# Patient Record
Sex: Female | Born: 1955 | Race: White | Hispanic: No | Marital: Married | Smoking: Never smoker
Health system: Southern US, Community
[De-identification: ages and names within clinical notes are randomized; demographics above are authoritative.]

## PROBLEM LIST (undated history)

## (undated) DIAGNOSIS — I10 Essential (primary) hypertension: Secondary | ICD-10-CM

## (undated) HISTORY — DX: Essential (primary) hypertension: I10

## (undated) HISTORY — PX: YAG LASER APPLICATION: SHX6189

## (undated) HISTORY — PX: CATARACT EXTRACTION: SUR2

---

## 1998-01-13 ENCOUNTER — Other Ambulatory Visit: Admission: RE | Admit: 1998-01-13 | Discharge: 1998-01-13 | Payer: Self-pay | Admitting: *Deleted

## 1998-07-01 ENCOUNTER — Inpatient Hospital Stay (HOSPITAL_COMMUNITY): Admission: RE | Admit: 1998-07-01 | Discharge: 1998-07-03 | Payer: Self-pay | Admitting: Neurological Surgery

## 1999-02-02 ENCOUNTER — Ambulatory Visit (HOSPITAL_COMMUNITY): Admission: RE | Admit: 1999-02-02 | Discharge: 1999-02-02 | Payer: Self-pay | Admitting: Obstetrics and Gynecology

## 1999-02-02 ENCOUNTER — Encounter: Payer: Self-pay | Admitting: Obstetrics and Gynecology

## 1999-11-30 ENCOUNTER — Ambulatory Visit (HOSPITAL_COMMUNITY): Admission: RE | Admit: 1999-11-30 | Discharge: 1999-11-30 | Payer: Self-pay | Admitting: Cardiology

## 1999-11-30 ENCOUNTER — Encounter: Payer: Self-pay | Admitting: Cardiology

## 2006-05-06 ENCOUNTER — Emergency Department (HOSPITAL_COMMUNITY): Admission: EM | Admit: 2006-05-06 | Discharge: 2006-05-06 | Payer: Self-pay | Admitting: Emergency Medicine

## 2006-05-11 ENCOUNTER — Ambulatory Visit (HOSPITAL_BASED_OUTPATIENT_CLINIC_OR_DEPARTMENT_OTHER): Admission: RE | Admit: 2006-05-11 | Discharge: 2006-05-12 | Payer: Self-pay | Admitting: *Deleted

## 2006-08-04 ENCOUNTER — Other Ambulatory Visit: Admission: RE | Admit: 2006-08-04 | Discharge: 2006-08-04 | Payer: Self-pay | Admitting: Family Medicine

## 2009-01-08 ENCOUNTER — Emergency Department (HOSPITAL_COMMUNITY): Admission: EM | Admit: 2009-01-08 | Discharge: 2009-01-08 | Payer: Self-pay | Admitting: Emergency Medicine

## 2009-06-18 ENCOUNTER — Emergency Department (HOSPITAL_COMMUNITY): Admission: EM | Admit: 2009-06-18 | Discharge: 2009-06-18 | Payer: Self-pay | Admitting: Emergency Medicine

## 2009-12-24 ENCOUNTER — Emergency Department (HOSPITAL_COMMUNITY): Admission: EM | Admit: 2009-12-24 | Discharge: 2009-12-24 | Payer: Self-pay | Admitting: Emergency Medicine

## 2010-01-26 ENCOUNTER — Inpatient Hospital Stay (HOSPITAL_COMMUNITY): Admission: RE | Admit: 2010-01-26 | Discharge: 2010-01-28 | Payer: Self-pay | Admitting: Orthopedic Surgery

## 2010-02-23 ENCOUNTER — Encounter: Admission: RE | Admit: 2010-02-23 | Discharge: 2010-05-12 | Payer: Self-pay | Admitting: Orthopedic Surgery

## 2011-02-28 LAB — DIFFERENTIAL
Basophils Relative: 0 % (ref 0–1)
Eosinophils Absolute: 0.1 10*3/uL (ref 0.0–0.7)
Eosinophils Relative: 2 % (ref 0–5)
Lymphs Abs: 1.1 10*3/uL (ref 0.7–4.0)
Monocytes Absolute: 0.4 10*3/uL (ref 0.1–1.0)
Monocytes Relative: 8 % (ref 3–12)
Neutrophils Relative %: 67 % (ref 43–77)

## 2011-02-28 LAB — CBC
HCT: 38.5 % (ref 36.0–46.0)
MCHC: 33.1 g/dL (ref 30.0–36.0)
MCV: 87.3 fL (ref 78.0–100.0)
RBC: 4.41 MIL/uL (ref 3.87–5.11)
WBC: 4.8 10*3/uL (ref 4.0–10.5)

## 2011-03-03 LAB — BASIC METABOLIC PANEL
CO2: 25 mEq/L (ref 19–32)
Calcium: 8.5 mg/dL (ref 8.4–10.5)
Chloride: 104 mEq/L (ref 96–112)
Chloride: 106 mEq/L (ref 96–112)
GFR calc Af Amer: >60 mL/min (ref >60)
GFR calc Af Amer: >60 mL/min (ref >60)
Potassium: 3.7 mEq/L (ref 3.5–5.1)
Potassium: 4 mEq/L (ref 3.5–5.1)
Sodium: 137 mEq/L (ref 135–145)
Sodium: 140 mEq/L (ref 135–145)

## 2011-03-03 LAB — CBC
HCT: 27.7 % — ABNORMAL LOW (ref 36.0–46.0)
HCT: 29.8 % — ABNORMAL LOW (ref 36.0–46.0)
Hemoglobin: 10.1 g/dL — ABNORMAL LOW (ref 12.0–15.0)
Hemoglobin: 9.4 g/dL — ABNORMAL LOW (ref 12.0–15.0)
MCHC: 33.9 g/dL (ref 30.0–36.0)
MCHC: 34 g/dL (ref 30.0–36.0)
MCV: 88.1 fL (ref 78.0–100.0)
MCV: 88.3 fL (ref 78.0–100.0)
Platelets: 310 10*3/uL (ref 150–400)
RBC: 3.14 MIL/uL — ABNORMAL LOW (ref 3.87–5.11)
RBC: 3.32 MIL/uL — ABNORMAL LOW (ref 3.87–5.11)
RDW: 15 % (ref 11.5–15.5)

## 2011-03-03 LAB — COMPREHENSIVE METABOLIC PANEL
ALT: 18 U/L (ref 0–35)
AST: 18 U/L (ref 0–37)
Albumin: 4.1 g/dL (ref 3.5–5.2)
CO2: 26 mEq/L (ref 19–32)
Calcium: 9.3 mg/dL (ref 8.4–10.5)
Creatinine, Ser: 0.8 mg/dL (ref 0.4–1.2)
GFR calc Af Amer: >60 mL/min (ref >60)
GFR calc non Af Amer: >60 mL/min (ref >60)
Sodium: 140 mEq/L (ref 135–145)
Total Protein: 7.2 g/dL (ref 6.0–8.3)

## 2011-03-03 LAB — ABO/RH: ABO/RH(D): O POS

## 2011-03-03 LAB — PROTIME-INR
INR: 1.02 (ref 0.00–1.49)
INR: 1.2 (ref 0.00–1.49)
INR: 1.5 — ABNORMAL HIGH (ref 0.00–1.49)
Prothrombin Time: 13.3 seconds (ref 11.6–15.2)

## 2011-03-03 LAB — URINE CULTURE

## 2011-03-03 LAB — URINALYSIS, ROUTINE W REFLEX MICROSCOPIC
Glucose, UA: NEGATIVE mg/dL
Protein, ur: NEGATIVE mg/dL
Specific Gravity, Urine: 1.025 (ref 1.005–1.030)
Urobilinogen, UA: 0.2 mg/dL (ref 0.0–1.0)

## 2011-03-03 LAB — DIFFERENTIAL
Eosinophils Absolute: 0.1 10*3/uL (ref 0.0–0.7)
Eosinophils Relative: 2 % (ref 0–5)
Lymphocytes Relative: 30 % (ref 12–46)
Lymphs Abs: 1.8 10*3/uL (ref 0.7–4.0)
Monocytes Relative: 6 % (ref 3–12)

## 2011-03-03 LAB — TYPE AND SCREEN

## 2011-03-21 LAB — DIFFERENTIAL
Eosinophils Absolute: 0.1 10*3/uL (ref 0.0–0.7)
Eosinophils Relative: 2 % (ref 0–5)
Lymphs Abs: 1.6 10*3/uL (ref 0.7–4.0)
Monocytes Relative: 8 % (ref 3–12)

## 2011-03-21 LAB — COMPREHENSIVE METABOLIC PANEL
Albumin: 4.1 g/dL (ref 3.5–5.2)
Alkaline Phosphatase: 93 U/L (ref 39–117)
BUN: 12 mg/dL (ref 6–23)
Chloride: 102 mEq/L (ref 96–112)
Potassium: 5.1 mEq/L (ref 3.5–5.1)
Total Bilirubin: 1.2 mg/dL (ref 0.3–1.2)

## 2011-03-21 LAB — URINALYSIS, ROUTINE W REFLEX MICROSCOPIC
Bilirubin Urine: NEGATIVE
Hgb urine dipstick: NEGATIVE
Protein, ur: NEGATIVE mg/dL
Urobilinogen, UA: 0.2 mg/dL (ref 0.0–1.0)

## 2011-03-21 LAB — CBC
HCT: 32.5 % — ABNORMAL LOW (ref 36.0–46.0)
Hemoglobin: 10.7 g/dL — ABNORMAL LOW (ref 12.0–15.0)
MCV: 89.6 fL (ref 78.0–100.0)
RBC: 3.63 MIL/uL — ABNORMAL LOW (ref 3.87–5.11)
WBC: 7 10*3/uL (ref 4.0–10.5)

## 2011-04-30 NOTE — Op Note (Signed)
NAMEJANIE, CAPP NO.:  [id number]   MEDICAL RECORD NO.:  [id number]          PATIENT TYPE:  AMB   LOCATION:  DSC                          FACILITY:  [facility]   PHYSICIAN:  Tennis Must Meyerdierks, M.D.DATE OF BIRTH:  [birth year]   DATE OF PROCEDURE:  05/11/2006  DATE OF DISCHARGE:                                 OPERATIVE REPORT   PREOPERATIVE DIAGNOSIS:  Fracture right distal radius.   POSTOPERATIVE DIAGNOSIS:  Fracture right distal radius.   PROCEDURE:  Open reduction and internal fixation of right distal radius  fracture.   SURGEON:  Lowell Bouton, M.D.   ANESTHESIA:  General.   OPERATIVE FINDINGS:  The patient had a comminuted distal radius fracture  that was dorsally angulated.   PROCEDURE:  Under general anesthesia with a tourniquet on the right arm, the  right hand was prepped and draped in the usual fashion and after  exsanguinating the limb, the tourniquet was inflated to 250 mmHg.  The index  and long fingers were placed in finger traps and 10 pounds of traction was  applied across the end of the table.  A longitudinal incision was made  overlying the FCR tendon in the wrist and carried down through the  subcutaneous tissues.  Bleeding points were coagulated.  Blunt dissection  was carried through the FCR sheath and the FCR tendon was retracted ulnarly.  The floor of the sheath was incised with the scissors.  Blunt dissection was  then carried down through the muscles to the pronator quadratus.  This was  then sharply incised off of the radial attachment to the radius.  A Freer  elevator was used to elevate the pronator off of the arm radius.  The  fracture site was then identified and a Therapist, nutritional was used to reduce  the fracture.  The x-rays showed good position and the traction was  released.  A right sided DVR standard plate was then applied.  The sliding  screw was inserted first and then adjusted under x-ray control.  Two  of the  non-threaded pegs were then inserted and then alignment was again evaluated  and looked good.  The remaining 3.5 mm screws were then placed proximally  using three screws proximally, leaving the most proximal hole empty as it  was ulnarly placed on the radius.  The pegs were then inserted using smooth  pegs in the proximal four holes and partially threaded pegs in two of the  distal holes.  One peg hole was left empty.  The wound was then irrigated  copiously with saline.  The pronator quadratus was reattached with 4-0  Vicryl suture.  A TLS drain was inserted and the subcutaneous tissue was  closed with 4-0 Vicryl.  The skin was closed with 3-0 subcuticular Prolene.  Steri-Strips were applied.  0.5% Marcaine was inserted in the wound edges  for pain control. The patient was placed in a volar wrist splint.  She went  to the recovery room awake in stable, good condition.      Lowell Bouton, M.D.  Electronically Signed  EMM/MEDQ  D:  05/11/2006  T:  05/11/2006  Job:  [id number]

## 2012-10-31 ENCOUNTER — Other Ambulatory Visit (HOSPITAL_COMMUNITY)
Admission: RE | Admit: 2012-10-31 | Discharge: 2012-10-31 | Disposition: A | Discharge status: Home / Self Care | Payer: Medicare Other | Source: Ambulatory Visit | Attending: Family Medicine | Admitting: Family Medicine

## 2012-10-31 ENCOUNTER — Other Ambulatory Visit: Payer: Self-pay | Admitting: Family Medicine

## 2012-10-31 DIAGNOSIS — Z124 Encounter for screening for malignant neoplasm of cervix: Secondary | ICD-10-CM | POA: Insufficient documentation

## 2012-10-31 DIAGNOSIS — Z1151 Encounter for screening for human papillomavirus (HPV): Secondary | ICD-10-CM | POA: Insufficient documentation

## 2014-02-06 ENCOUNTER — Ambulatory Visit (INDEPENDENT_AMBULATORY_CARE_PROVIDER_SITE_OTHER): Payer: Medicare Other | Admitting: Cardiovascular Disease

## 2014-02-06 ENCOUNTER — Ambulatory Visit: Payer: Medicare Other | Admitting: Cardiovascular Disease

## 2014-02-06 ENCOUNTER — Encounter: Payer: Self-pay | Admitting: Cardiovascular Disease

## 2014-02-06 VITALS — BP 130/76 | HR 54 | Resp 16 | Ht 64.0 in | Wt 186.8 lb

## 2014-02-06 DIAGNOSIS — E785 Hyperlipidemia, unspecified: Secondary | ICD-10-CM

## 2014-02-06 DIAGNOSIS — N183 Chronic kidney disease, stage 3 unspecified: Secondary | ICD-10-CM

## 2014-02-06 DIAGNOSIS — E1122 Type 2 diabetes mellitus with diabetic chronic kidney disease: Secondary | ICD-10-CM

## 2014-02-06 DIAGNOSIS — R9431 Abnormal electrocardiogram [ECG] [EKG]: Secondary | ICD-10-CM

## 2014-02-06 DIAGNOSIS — E1129 Type 2 diabetes mellitus with other diabetic kidney complication: Secondary | ICD-10-CM

## 2014-02-06 DIAGNOSIS — R0602 Shortness of breath: Secondary | ICD-10-CM

## 2014-02-06 DIAGNOSIS — I10 Essential (primary) hypertension: Secondary | ICD-10-CM

## 2014-02-06 DIAGNOSIS — Z01810 Encounter for preprocedural cardiovascular examination: Secondary | ICD-10-CM

## 2014-02-06 NOTE — Patient Instructions (Signed)
Your physician has requested that you have a lexiscan myoview. For further information please visit [email or web address]. Please follow instruction sheet, as given.  We will call you with the results.  Your physician recommends that you schedule a follow-up appointment in: AS NEEDED with Dr. Royann Shiversroitoru.

## 2014-02-08 ENCOUNTER — Encounter: Payer: Self-pay | Admitting: Cardiovascular Disease

## 2014-02-08 DIAGNOSIS — Z01810 Encounter for preprocedural cardiovascular examination: Secondary | ICD-10-CM

## 2014-02-08 DIAGNOSIS — N183 Chronic kidney disease, stage 3 unspecified: Secondary | ICD-10-CM | POA: Insufficient documentation

## 2014-02-08 DIAGNOSIS — E1122 Type 2 diabetes mellitus with diabetic chronic kidney disease: Secondary | ICD-10-CM | POA: Insufficient documentation

## 2014-02-08 DIAGNOSIS — I10 Essential (primary) hypertension: Secondary | ICD-10-CM | POA: Insufficient documentation

## 2014-02-08 DIAGNOSIS — R9431 Abnormal electrocardiogram [ECG] [EKG]: Secondary | ICD-10-CM | POA: Insufficient documentation

## 2014-02-08 DIAGNOSIS — E785 Hyperlipidemia, unspecified: Secondary | ICD-10-CM | POA: Insufficient documentation

## 2014-02-08 NOTE — Progress Notes (Signed)
Patient ID: Renee Snyder, female   DOB: [birth year], 58 y.o.   MRN: [id number]      Reason for office visit Abnormal ECG, preoperative cardiovascular exam  Renee Snyder is a 58 year old woman without known structural heart disease, but with numerous coronary risk factors (diabetes mellitus, hypertension, hyperlipidemia) and abnormalities on her preoperative electrocardiogram. She plans to undergo right total knee replacement in the near future with Dr. Thurston Hole. The procedure has not yet been scheduled. She had left total knee replacement about 3-4 years ago and had no cardiac problems at that time. Her electrocardiogram has changed. Unfortunately, I don't have access to the ECG that triggered this referral, but in our office her echocardiogram shows flattened T waves throughout the entire anterior precordium as well as negative T waves in a couple of the inferior leads. The background rhythm is sinus bradycardia. There is no evidence of left ventricular hypertrophy.  She denies chest pain at rest or with activity but does complain of shortness of breath. It is hard to establish her functional status and certainly problems over down more and cardiac symptoms 2. She tries to walk 2-3 days a week for 20-25 minutes at a time but is often unable to exercise for a couple of days afterwards because of leg pain. As far as we know there is no family history of premature coronary disease. Her mother died young, at age 50 of noncardiac causes.   No Known Allergies  Current Outpatient Prescriptions  Medication Sig Dispense Refill  . amLODipine (NORVASC) 5 MG tablet Take 5 mg by mouth daily.      . cholecalciferol (VITAMIN D) 1000 UNITS tablet Take 1,000 Units by mouth daily.      Marland Kitchen losartan (COZAAR) 100 MG tablet Take 100 mg by mouth daily.      . metoprolol succinate (TOPROL-XL) 100 MG 24 hr tablet Take 100 mg by mouth 2 (two) times daily. Take with or immediately following a meal.      . oxycodone  (ROXICODONE) 30 MG immediate release tablet Take 30 mg by mouth every 4 (four) hours as needed for pain.      Marland Kitchen QUEtiapine Fumarate (SEROQUEL XR) 150 MG 24 hr tablet Take 150 mg by mouth at bedtime.      . rosuvastatin (CRESTOR) 20 MG tablet Take 20 mg by mouth daily.      Marland Kitchen venlafaxine XR (EFFEXOR-XR) 150 MG 24 hr capsule Take 150 mg by mouth daily with breakfast.       No current facility-administered medications for this visit.    No past medical history on file.  No past surgical history on file.  No family history on file.  History   Social History  . Marital Status: Married    Spouse Name: N/A    Number of Children: N/A  . Years of Education: N/A   Occupational History  . Not on file.   Social History Main Topics  . Smoking status: Never Smoker   . Smokeless tobacco: Not on file  . Alcohol Use: Yes     Comment: occas  . Drug Use: No  . Sexual Activity: Not on file   Other Topics Concern  . Not on file   Social History Narrative  . No narrative on file    Review of systems: The patient specifically denies any chest pain at rest or with exertion, dyspnea at rest or with exertion, orthopnea, paroxysmal nocturnal dyspnea, syncope, palpitations, focal neurological deficits, intermittent claudication, lower extremity edema,  unexplained weight gain, cough, hemoptysis or wheezing.  The patient also denies abdominal pain, nausea, vomiting, dysphagia, diarrhea, constipation, polyuria, polydipsia, dysuria, hematuria, frequency, urgency, abnormal bleeding or bruising, fever, chills, unexpected weight changes, mood swings, change in skin or hair texture, change in voice quality, auditory or visual problems, allergic reactions or rashes, new musculoskeletal complaints other than usual "aches and pains".   PHYSICAL EXAM BP 130/76  Pulse 54  Resp 16  Ht 5\' 4"  (1.626 m)  Wt 84.732 kg (186 lb 12.8 oz)  BMI 32.05 kg/m2  General: Alert, oriented x3, no distress Head: no  evidence of trauma, PERRL, EOMI, no exophtalmos or lid lag, no myxedema, no xanthelasma; normal ears, nose and oropharynx Neck: normal jugular venous pulsations and no hepatojugular reflux; brisk carotid pulses without delay and no carotid bruits Chest: clear to auscultation, no signs of consolidation by percussion or palpation, normal fremitus, symmetrical and full respiratory excursions Cardiovascular: normal position and quality of the apical impulse, regular rhythm, normal first and second heart sounds, no murmurs, rubs or gallops Abdomen: no tenderness or distention, no masses by palpation, no abnormal pulsatility or arterial bruits, normal bowel sounds, no hepatosplenomegaly Extremities: no clubbing, cyanosis or edema; 2+ radial, ulnar and brachial pulses bilaterally; 2+ right femoral, posterior tibial and dorsalis pedis pulses; 2+ left femoral, posterior tibial and dorsalis pedis pulses; no subclavian or femoral bruits Neurological: grossly nonfocal   EKG: Sinus bradycardia, nonspecific ST-T wave changes described above  Lipid Panel  January 2015 total cholesterol 158, triglycerides 105, HDL 51, LDL 86 Hemoglobin Z3YA1c 6.9% TSH mildly elevated at 5.4, free T4 at the lower limit of normal 0.63  BMET Glucose 117, creatinine 1.3 to (GFR 41), followup creatinine 2 weeks later improved at 1.27 (estimated GFR 43),  potassium 4.5 normal LFTs Hemoglobin 12.7   ASSESSMENT AND PLAN New electrocardiogram abnormalities consistent with ischemia noted during preoperative cardiovascular examination Unfortunately, due to her orthopedic limitations it is hard to assess her functional status and she is unable to perform a simple treadmill stress test. Because of the new electrocardiographic abnormalities, in the setting of at least intermediate risk factors for coronary disease (obesity, diabetes mellitus with borderline control, hyperlipidemia, hypertension) have recommended that she have a functional  study (Lexiscan Myoview) before she undergoes surgery. If the study is normal, I would not perform additional cardiac evaluation before her surgery. She already takes a high dose of beta blocker and this should be continued without interruption around the time of surgery. However, if there is a high risk of finding (a large reversible anterior defect), have recommended coronary angiography before she has any type of noncardiac surgery.   Orders Placed This Encounter  Procedures  . Myocardial Perfusion Imaging  . EKG 12-Lead   Meds ordered this encounter  Medications  . metoprolol succinate (TOPROL-XL) 100 MG 24 hr tablet    Sig: Take 100 mg by mouth 2 (two) times daily. Take with or immediately following a meal.  . losartan (COZAAR) 100 MG tablet    Sig: Take 100 mg by mouth daily.  Marland Kitchen. amLODipine (NORVASC) 5 MG tablet    Sig: Take 5 mg by mouth daily.  Marland Kitchen. venlafaxine XR (EFFEXOR-XR) 150 MG 24 hr capsule    Sig: Take 150 mg by mouth daily with breakfast.  . QUEtiapine Fumarate (SEROQUEL XR) 150 MG 24 hr tablet    Sig: Take 150 mg by mouth at bedtime.  Marland Kitchen. oxycodone (ROXICODONE) 30 MG immediate release tablet    Sig:  Take 30 mg by mouth every 4 (four) hours as needed for pain.  . cholecalciferol (VITAMIN D) 1000 UNITS tablet    Sig: Take 1,000 Units by mouth daily.  . rosuvastatin (CRESTOR) 20 MG tablet    Sig: Take 20 mg by mouth daily.    Junious Silk, MD, [facility] [phone] office [phone] pager

## 2014-02-08 NOTE — Assessment & Plan Note (Deleted)
Unfortunately, due to her orthopedic limitations it is hard to assess her functional status and she is unable to perform a simple treadmill stress test. Because of the new electrocardiographic abnormalities, in the setting of at least intermediate risk factors for coronary disease (obesity, diabetes mellitus with borderline control, hyperlipidemia, hypertension) have recommended that she have a functional study (Lexiscan Myoview) before she undergoes surgery. If the study is normal, I would not perform additional cardiac evaluation before her surgery. She already takes a high dose of beta blocker and this should be continued without interruption around the time of surgery. However, if there is a high risk of finding (a large reversible anterior defect), have recommended coronary angiography before she has any type of noncardiac surgery.

## 2014-02-08 NOTE — Assessment & Plan Note (Signed)
Unfortunately, due to her orthopedic limitations it is hard to assess her functional status and she is unable to perform a simple treadmill stress test. Because of the new electrocardiographic abnormalities, in the setting of at least intermediate risk factors for coronary disease (obesity, diabetes mellitus with borderline control, hyperlipidemia, hypertension) have recommended that she have a functional study (Lexiscan Myoview) before she undergoes surgery. If the study is normal, I would not perform additional cardiac evaluation before her surgery. She already takes a high dose of beta blocker and this should be continued without interruption around the time of surgery. However, if there is a high risk of finding (a large reversible anterior defect), have recommended coronary angiography before she has any type of noncardiac surgery. 

## 2014-02-15 ENCOUNTER — Ambulatory Visit (HOSPITAL_COMMUNITY)
Admission: RE | Admit: 2014-02-15 | Discharge: 2014-02-15 | Disposition: A | Discharge status: Home / Self Care | Payer: Medicare Other | Source: Ambulatory Visit | Attending: Cardiovascular Disease | Admitting: Cardiovascular Disease

## 2014-02-15 DIAGNOSIS — R0602 Shortness of breath: Secondary | ICD-10-CM

## 2014-02-15 DIAGNOSIS — R9431 Abnormal electrocardiogram [ECG] [EKG]: Secondary | ICD-10-CM | POA: Insufficient documentation

## 2014-02-15 MED ORDER — REGADENOSON 0.4 MG/5ML IV SOLN
0.4000 mg | Freq: Once | INTRAVENOUS | Status: AC
  Administered 2014-02-15: 0.4 mg via INTRAVENOUS

## 2014-02-15 MED ORDER — TECHNETIUM TC 99M SESTAMIBI GENERIC - CARDIOLITE
10.8000 | Freq: Once | INTRAVENOUS | Status: AC | PRN
  Administered 2014-02-15: 11 via INTRAVENOUS

## 2014-02-15 MED ORDER — TECHNETIUM TC 99M SESTAMIBI GENERIC - CARDIOLITE
29.6000 | Freq: Once | INTRAVENOUS | Status: AC | PRN
  Administered 2014-02-15: 30 via INTRAVENOUS

## 2014-02-15 MED ORDER — AMINOPHYLLINE 25 MG/ML IV SOLN
75.0000 mg | Freq: Once | INTRAVENOUS | Status: AC
Start: 2014-02-15 — End: 2014-02-15
  Administered 2014-02-15: 75 mg via INTRAVENOUS

## 2014-02-15 NOTE — Procedures (Addendum)
[facility] [address] [place] [address] [phone]  Cardiology Nuclear Med Study  Annia BeltMorgan Rowles is a 58 y.o. female     MRN : [id number]     DOB: [birth year]  Procedure Date: 02/15/2014  Nuclear Med Background Indication for Stress Test:  Surgical Clearance and Abnormal EKG History:  No prior cardiac or respiratory history reported. Cardiac Risk Factors: Hypertension, Lipids and Obesity  Symptoms:  SOB   Nuclear Pre-Procedure Caffeine/Decaff Intake:  1:00am NPO After: 11am   IV Site: R Hand  IV 0.9% NS with Angio Cath:  22g  Chest Size (in):  n/a IV Started by: Emmit PomfretAmanda Hicks, RN  Height: 5\' 4"  (1.626 m)  Cup Size: C  BMI:  Body mass index is 31.91 kg/(m^2). Weight:  186 lb (84.369 kg)   Tech Comments:  n/a    Nuclear Med Study 1 or 2 day study: 1 day  Stress Test Type:  Lexiscan  Order Authorizing Provider:  Thurmon FairMihai Croitoru, MD   Resting Radionuclide: Technetium 8837m Sestamibi  Resting Radionuclide Dose: 10.8 mCi   Stress Radionuclide:  Technetium 7237m Sestamibi  Stress Radionuclide Dose: 29.6 mCi           Stress Protocol Rest HR: 52 Stress HR: 38  Rest BP: 120/81 Stress BP:  106/82  Exercise Time (min): n/a METS: n/a   Predicted Max HR: 161 bpm % Max HR: 39.13 bpm Rate Pressure Product: 7560  Dose of Adenosine (mg):  n/a Dose of Lexiscan: 0.4 mg  Dose of Atropine (mg): n/a Dose of Dobutamine: n/a mcg/kg/min (at max HR)  Stress Test Technologist: Esperanza Sheetserry-Marie Martin, CCT Nuclear Technologist: Gonzella LexPam Phillips, CNMT   Rest Procedure:  Myocardial perfusion imaging was performed at rest 45 minutes following the intravenous administration of Technetium 7937m Sestamibi. Stress Procedure:  The patient received IV Lexiscan 0.4 mg over 15-seconds.  Technetium 7337m Sestamibi injected at 30-seconds. The patient experienced SOB, Dizziness, Stomach discomfort and right arm discomfort; 75 mg of IV Aminophylline was administered  with resolution of symptoms. There were no significant changes with Lexiscan.  Quantitative spect images were obtained after a 45 minute delay.  Transient Ischemic Dilatation (Normal <1.22):  0.95 Lung/Heart Ratio (Normal <0.45):  0.28 QGS EDV:  84 ml QGS ESV:  23 ml LV Ejection Fraction: 73%  Signed by     Rest ECG: NSR - Normal EKG  Stress ECG: No significant change from baseline ECG  QPS Raw Data Images:  Normal; no motion artifact; normal heart/lung ratio. Stress Images:  Normal homogeneous uptake in all areas of the myocardium. Rest Images:  Normal homogeneous uptake in all areas of the myocardium. Subtraction (SDS):  No evidence of ischemia.  Impression Exercise Capacity:  Lexiscan with no exercise. BP Response:  Normal blood pressure response. Clinical Symptoms:  No significant symptoms noted. ECG Impression:  No significant ST segment change suggestive of ischemia. Comparison with Prior Nuclear Study: No previous nuclear study performed  Overall Impression:  Normal stress nuclear study.  LV Wall Motion:  NL LV Function; NL Wall Motion   Runell GessBERRY,JONATHAN J, MD  02/15/2014 5:22 PM

## 2014-02-19 ENCOUNTER — Telehealth: Payer: Self-pay | Admitting: *Deleted

## 2014-02-19 NOTE — Telephone Encounter (Signed)
Message copied by Vita BarleyLASSITER, Arin Peral A on Tue Feb 19, 2014 12:13 PM ------      Message from: Thurmon FairROITORU, MIHAI      Created: Fri Feb 15, 2014  6:40 PM       Normal nuclear stress test ------

## 2014-02-19 NOTE — Telephone Encounter (Signed)
Surgical clearance letter sent to Dr. Wainer 

## 2014-02-26 ENCOUNTER — Ambulatory Visit: Payer: Medicare Other | Admitting: Cardiovascular Disease

## 2014-03-25 ENCOUNTER — Ambulatory Visit (HOSPITAL_COMMUNITY): Payer: Medicare Other | Attending: Orthopedic Surgery | Admitting: Cardiology

## 2014-03-25 ENCOUNTER — Other Ambulatory Visit (HOSPITAL_COMMUNITY): Payer: Self-pay | Admitting: *Deleted

## 2014-03-25 DIAGNOSIS — M7989 Other specified soft tissue disorders: Secondary | ICD-10-CM

## 2014-03-25 DIAGNOSIS — M79609 Pain in unspecified limb: Secondary | ICD-10-CM

## 2014-03-25 DIAGNOSIS — R609 Edema, unspecified: Secondary | ICD-10-CM

## 2014-03-25 NOTE — Progress Notes (Signed)
Lower venous duplex limited completed 

## 2014-04-22 ENCOUNTER — Other Ambulatory Visit (HOSPITAL_COMMUNITY): Payer: Medicare Other

## 2014-04-29 ENCOUNTER — Inpatient Hospital Stay (HOSPITAL_COMMUNITY): Admission: RE | Admit: 2014-04-29 | Payer: Medicare Other | Source: Ambulatory Visit | Admitting: Orthopedic Surgery

## 2014-04-29 ENCOUNTER — Encounter (HOSPITAL_COMMUNITY): Admission: RE | Payer: Self-pay | Source: Ambulatory Visit

## 2014-04-29 SURGERY — ARTHROPLASTY, KNEE, TOTAL
Anesthesia: General | Site: Knee | Laterality: Right

## 2014-08-30 ENCOUNTER — Other Ambulatory Visit (HOSPITAL_COMMUNITY): Payer: Medicare Other

## 2014-09-09 ENCOUNTER — Inpatient Hospital Stay (HOSPITAL_COMMUNITY): Admission: RE | Admit: 2014-09-09 | Payer: Medicare Other | Source: Ambulatory Visit | Admitting: Orthopedic Surgery

## 2014-09-09 ENCOUNTER — Encounter (HOSPITAL_COMMUNITY): Admission: RE | Payer: Self-pay | Source: Ambulatory Visit

## 2014-09-09 SURGERY — ARTHROPLASTY, KNEE, TOTAL
Anesthesia: General | Laterality: Right

## 2016-03-15 DIAGNOSIS — F3181 Bipolar II disorder: Secondary | ICD-10-CM | POA: Diagnosis not present

## 2016-03-15 DIAGNOSIS — M170 Bilateral primary osteoarthritis of knee: Secondary | ICD-10-CM | POA: Diagnosis not present

## 2016-05-12 DIAGNOSIS — G8929 Other chronic pain: Secondary | ICD-10-CM | POA: Diagnosis not present

## 2016-05-12 DIAGNOSIS — M170 Bilateral primary osteoarthritis of knee: Secondary | ICD-10-CM | POA: Diagnosis not present

## 2016-05-12 DIAGNOSIS — G894 Chronic pain syndrome: Secondary | ICD-10-CM | POA: Diagnosis not present

## 2016-05-12 DIAGNOSIS — F3181 Bipolar II disorder: Secondary | ICD-10-CM | POA: Diagnosis not present

## 2016-05-12 DIAGNOSIS — M25562 Pain in left knee: Secondary | ICD-10-CM | POA: Diagnosis not present

## 2016-05-12 DIAGNOSIS — M25561 Pain in right knee: Secondary | ICD-10-CM | POA: Diagnosis not present

## 2016-07-14 DIAGNOSIS — M25562 Pain in left knee: Secondary | ICD-10-CM | POA: Diagnosis not present

## 2016-07-14 DIAGNOSIS — M25561 Pain in right knee: Secondary | ICD-10-CM | POA: Diagnosis not present

## 2016-07-14 DIAGNOSIS — M170 Bilateral primary osteoarthritis of knee: Secondary | ICD-10-CM | POA: Diagnosis not present

## 2016-07-14 DIAGNOSIS — F3181 Bipolar II disorder: Secondary | ICD-10-CM | POA: Diagnosis not present

## 2016-07-14 DIAGNOSIS — G8929 Other chronic pain: Secondary | ICD-10-CM | POA: Diagnosis not present

## 2016-08-17 DIAGNOSIS — Z23 Encounter for immunization: Secondary | ICD-10-CM | POA: Diagnosis not present

## 2016-09-15 DIAGNOSIS — M170 Bilateral primary osteoarthritis of knee: Secondary | ICD-10-CM | POA: Diagnosis not present

## 2016-09-15 DIAGNOSIS — F3181 Bipolar II disorder: Secondary | ICD-10-CM | POA: Diagnosis not present

## 2016-09-15 DIAGNOSIS — G8929 Other chronic pain: Secondary | ICD-10-CM | POA: Diagnosis not present

## 2016-09-15 DIAGNOSIS — M545 Low back pain: Secondary | ICD-10-CM | POA: Diagnosis not present

## 2016-09-24 DIAGNOSIS — K645 Perianal venous thrombosis: Secondary | ICD-10-CM | POA: Diagnosis not present

## 2016-09-24 DIAGNOSIS — Z1389 Encounter for screening for other disorder: Secondary | ICD-10-CM | POA: Diagnosis not present

## 2016-10-26 DIAGNOSIS — R0789 Other chest pain: Secondary | ICD-10-CM | POA: Diagnosis not present

## 2016-10-29 ENCOUNTER — Other Ambulatory Visit: Payer: Self-pay | Admitting: Family Medicine

## 2016-10-29 DIAGNOSIS — R9389 Abnormal findings on diagnostic imaging of other specified body structures: Secondary | ICD-10-CM

## 2016-11-08 DIAGNOSIS — E782 Mixed hyperlipidemia: Secondary | ICD-10-CM | POA: Diagnosis not present

## 2016-11-08 DIAGNOSIS — E6609 Other obesity due to excess calories: Secondary | ICD-10-CM | POA: Diagnosis not present

## 2016-11-08 DIAGNOSIS — E119 Type 2 diabetes mellitus without complications: Secondary | ICD-10-CM | POA: Diagnosis not present

## 2016-11-08 DIAGNOSIS — G8929 Other chronic pain: Secondary | ICD-10-CM | POA: Diagnosis not present

## 2016-11-08 DIAGNOSIS — Z7984 Long term (current) use of oral hypoglycemic drugs: Secondary | ICD-10-CM | POA: Diagnosis not present

## 2016-11-08 DIAGNOSIS — Z6833 Body mass index (BMI) 33.0-33.9, adult: Secondary | ICD-10-CM | POA: Diagnosis not present

## 2016-11-08 DIAGNOSIS — I10 Essential (primary) hypertension: Secondary | ICD-10-CM | POA: Diagnosis not present

## 2016-11-09 ENCOUNTER — Ambulatory Visit
Admission: RE | Admit: 2016-11-09 | Discharge: 2016-11-09 | Disposition: A | Discharge status: Home / Self Care | Payer: Medicare Other | Source: Ambulatory Visit | Attending: Family Medicine | Admitting: Family Medicine

## 2016-11-09 DIAGNOSIS — R9389 Abnormal findings on diagnostic imaging of other specified body structures: Secondary | ICD-10-CM

## 2016-11-09 DIAGNOSIS — R918 Other nonspecific abnormal finding of lung field: Secondary | ICD-10-CM | POA: Diagnosis not present

## 2016-11-10 DIAGNOSIS — G894 Chronic pain syndrome: Secondary | ICD-10-CM | POA: Diagnosis not present

## 2016-11-10 DIAGNOSIS — G8929 Other chronic pain: Secondary | ICD-10-CM | POA: Diagnosis not present

## 2016-11-10 DIAGNOSIS — M791 Myalgia: Secondary | ICD-10-CM | POA: Diagnosis not present

## 2016-11-10 DIAGNOSIS — M545 Low back pain: Secondary | ICD-10-CM | POA: Diagnosis not present

## 2016-11-10 DIAGNOSIS — F3181 Bipolar II disorder: Secondary | ICD-10-CM | POA: Diagnosis not present

## 2016-11-10 DIAGNOSIS — M170 Bilateral primary osteoarthritis of knee: Secondary | ICD-10-CM | POA: Diagnosis not present

## 2017-01-12 DIAGNOSIS — M545 Low back pain: Secondary | ICD-10-CM | POA: Diagnosis not present

## 2017-01-12 DIAGNOSIS — G8929 Other chronic pain: Secondary | ICD-10-CM | POA: Diagnosis not present

## 2017-01-12 DIAGNOSIS — F3181 Bipolar II disorder: Secondary | ICD-10-CM | POA: Diagnosis not present

## 2017-01-12 DIAGNOSIS — M170 Bilateral primary osteoarthritis of knee: Secondary | ICD-10-CM | POA: Diagnosis not present

## 2017-04-11 DIAGNOSIS — M25561 Pain in right knee: Secondary | ICD-10-CM | POA: Diagnosis not present

## 2017-04-11 DIAGNOSIS — M545 Low back pain: Secondary | ICD-10-CM | POA: Diagnosis not present

## 2017-04-11 DIAGNOSIS — M25562 Pain in left knee: Secondary | ICD-10-CM | POA: Diagnosis not present

## 2017-04-11 DIAGNOSIS — F3181 Bipolar II disorder: Secondary | ICD-10-CM | POA: Diagnosis not present

## 2017-05-12 ENCOUNTER — Other Ambulatory Visit: Payer: Self-pay | Admitting: Family Medicine

## 2017-05-12 DIAGNOSIS — R918 Other nonspecific abnormal finding of lung field: Secondary | ICD-10-CM

## 2017-05-30 ENCOUNTER — Ambulatory Visit
Admission: RE | Admit: 2017-05-30 | Discharge: 2017-05-30 | Disposition: A | Discharge status: Home / Self Care | Payer: Medicare Other | Source: Ambulatory Visit | Attending: Family Medicine | Admitting: Family Medicine

## 2017-05-30 DIAGNOSIS — R918 Other nonspecific abnormal finding of lung field: Secondary | ICD-10-CM

## 2017-06-08 DIAGNOSIS — M170 Bilateral primary osteoarthritis of knee: Secondary | ICD-10-CM | POA: Diagnosis not present

## 2017-06-08 DIAGNOSIS — M545 Low back pain: Secondary | ICD-10-CM | POA: Diagnosis not present

## 2017-06-08 DIAGNOSIS — R7303 Prediabetes: Secondary | ICD-10-CM | POA: Diagnosis not present

## 2017-06-08 DIAGNOSIS — F3181 Bipolar II disorder: Secondary | ICD-10-CM | POA: Diagnosis not present

## 2017-06-27 DIAGNOSIS — Z6832 Body mass index (BMI) 32.0-32.9, adult: Secondary | ICD-10-CM | POA: Diagnosis not present

## 2017-06-27 DIAGNOSIS — I10 Essential (primary) hypertension: Secondary | ICD-10-CM | POA: Diagnosis not present

## 2017-06-27 DIAGNOSIS — E782 Mixed hyperlipidemia: Secondary | ICD-10-CM | POA: Diagnosis not present

## 2017-06-27 DIAGNOSIS — E119 Type 2 diabetes mellitus without complications: Secondary | ICD-10-CM | POA: Diagnosis not present

## 2017-08-10 DIAGNOSIS — M25561 Pain in right knee: Secondary | ICD-10-CM | POA: Diagnosis not present

## 2017-08-10 DIAGNOSIS — M170 Bilateral primary osteoarthritis of knee: Secondary | ICD-10-CM | POA: Diagnosis not present

## 2017-08-10 DIAGNOSIS — F3181 Bipolar II disorder: Secondary | ICD-10-CM | POA: Diagnosis not present

## 2017-08-10 DIAGNOSIS — M545 Low back pain: Secondary | ICD-10-CM | POA: Diagnosis not present

## 2017-08-26 DIAGNOSIS — Z23 Encounter for immunization: Secondary | ICD-10-CM | POA: Diagnosis not present

## 2017-10-11 DIAGNOSIS — E6609 Other obesity due to excess calories: Secondary | ICD-10-CM | POA: Diagnosis not present

## 2017-10-11 DIAGNOSIS — E119 Type 2 diabetes mellitus without complications: Secondary | ICD-10-CM | POA: Diagnosis not present

## 2017-10-11 DIAGNOSIS — I10 Essential (primary) hypertension: Secondary | ICD-10-CM | POA: Diagnosis not present

## 2017-10-11 DIAGNOSIS — E782 Mixed hyperlipidemia: Secondary | ICD-10-CM | POA: Diagnosis not present

## 2017-11-09 DIAGNOSIS — M545 Low back pain: Secondary | ICD-10-CM | POA: Diagnosis not present

## 2017-11-09 DIAGNOSIS — F3181 Bipolar II disorder: Secondary | ICD-10-CM | POA: Diagnosis not present

## 2017-11-09 DIAGNOSIS — G8929 Other chronic pain: Secondary | ICD-10-CM | POA: Diagnosis not present

## 2017-11-09 DIAGNOSIS — M170 Bilateral primary osteoarthritis of knee: Secondary | ICD-10-CM | POA: Diagnosis not present

## 2017-11-26 DIAGNOSIS — E119 Type 2 diabetes mellitus without complications: Secondary | ICD-10-CM | POA: Diagnosis not present

## 2017-11-26 DIAGNOSIS — H524 Presbyopia: Secondary | ICD-10-CM | POA: Diagnosis not present

## 2017-11-26 DIAGNOSIS — H5213 Myopia, bilateral: Secondary | ICD-10-CM | POA: Diagnosis not present

## 2017-11-26 DIAGNOSIS — H52222 Regular astigmatism, left eye: Secondary | ICD-10-CM | POA: Diagnosis not present

## 2018-01-11 DIAGNOSIS — F3181 Bipolar II disorder: Secondary | ICD-10-CM | POA: Diagnosis not present

## 2018-01-11 DIAGNOSIS — M545 Low back pain: Secondary | ICD-10-CM | POA: Diagnosis not present

## 2018-01-11 DIAGNOSIS — G8929 Other chronic pain: Secondary | ICD-10-CM | POA: Diagnosis not present

## 2018-01-11 DIAGNOSIS — M170 Bilateral primary osteoarthritis of knee: Secondary | ICD-10-CM | POA: Diagnosis not present

## 2018-02-08 DIAGNOSIS — H35362 Drusen (degenerative) of macula, left eye: Secondary | ICD-10-CM | POA: Diagnosis not present

## 2018-02-08 DIAGNOSIS — H43813 Vitreous degeneration, bilateral: Secondary | ICD-10-CM | POA: Diagnosis not present

## 2018-02-08 DIAGNOSIS — H25013 Cortical age-related cataract, bilateral: Secondary | ICD-10-CM | POA: Diagnosis not present

## 2018-02-08 DIAGNOSIS — H35033 Hypertensive retinopathy, bilateral: Secondary | ICD-10-CM | POA: Diagnosis not present

## 2018-04-12 DIAGNOSIS — F3181 Bipolar II disorder: Secondary | ICD-10-CM | POA: Diagnosis not present

## 2018-04-12 DIAGNOSIS — G894 Chronic pain syndrome: Secondary | ICD-10-CM | POA: Diagnosis not present

## 2018-04-12 DIAGNOSIS — M170 Bilateral primary osteoarthritis of knee: Secondary | ICD-10-CM | POA: Diagnosis not present

## 2018-04-12 DIAGNOSIS — M545 Low back pain: Secondary | ICD-10-CM | POA: Diagnosis not present

## 2018-04-25 DIAGNOSIS — Z1159 Encounter for screening for other viral diseases: Secondary | ICD-10-CM | POA: Diagnosis not present

## 2018-04-25 DIAGNOSIS — I129 Hypertensive chronic kidney disease with stage 1 through stage 4 chronic kidney disease, or unspecified chronic kidney disease: Secondary | ICD-10-CM | POA: Diagnosis not present

## 2018-04-25 DIAGNOSIS — E1122 Type 2 diabetes mellitus with diabetic chronic kidney disease: Secondary | ICD-10-CM | POA: Diagnosis not present

## 2018-04-25 DIAGNOSIS — N183 Chronic kidney disease, stage 3 (moderate): Secondary | ICD-10-CM | POA: Diagnosis not present

## 2018-04-25 DIAGNOSIS — E663 Overweight: Secondary | ICD-10-CM | POA: Diagnosis not present

## 2018-04-28 ENCOUNTER — Other Ambulatory Visit: Payer: Self-pay | Admitting: Family Medicine

## 2018-04-28 DIAGNOSIS — R911 Solitary pulmonary nodule: Secondary | ICD-10-CM

## 2018-05-04 ENCOUNTER — Other Ambulatory Visit: Payer: Medicare Other

## 2018-05-30 DIAGNOSIS — Z1211 Encounter for screening for malignant neoplasm of colon: Secondary | ICD-10-CM | POA: Diagnosis not present

## 2018-06-12 DIAGNOSIS — M545 Low back pain: Secondary | ICD-10-CM | POA: Diagnosis not present

## 2018-06-12 DIAGNOSIS — G8929 Other chronic pain: Secondary | ICD-10-CM | POA: Diagnosis not present

## 2018-06-12 DIAGNOSIS — M170 Bilateral primary osteoarthritis of knee: Secondary | ICD-10-CM | POA: Diagnosis not present

## 2018-06-12 DIAGNOSIS — F3181 Bipolar II disorder: Secondary | ICD-10-CM | POA: Diagnosis not present

## 2018-08-16 DIAGNOSIS — F3181 Bipolar II disorder: Secondary | ICD-10-CM | POA: Diagnosis not present

## 2018-08-16 DIAGNOSIS — G8929 Other chronic pain: Secondary | ICD-10-CM | POA: Diagnosis not present

## 2018-08-16 DIAGNOSIS — M545 Low back pain: Secondary | ICD-10-CM | POA: Diagnosis not present

## 2018-08-16 DIAGNOSIS — M170 Bilateral primary osteoarthritis of knee: Secondary | ICD-10-CM | POA: Diagnosis not present

## 2018-10-10 DIAGNOSIS — Z23 Encounter for immunization: Secondary | ICD-10-CM | POA: Diagnosis not present

## 2018-11-07 DIAGNOSIS — G894 Chronic pain syndrome: Secondary | ICD-10-CM | POA: Diagnosis not present

## 2018-11-07 DIAGNOSIS — F319 Bipolar disorder, unspecified: Secondary | ICD-10-CM | POA: Diagnosis not present

## 2018-11-07 DIAGNOSIS — M170 Bilateral primary osteoarthritis of knee: Secondary | ICD-10-CM | POA: Diagnosis not present

## 2018-11-21 DIAGNOSIS — M1712 Unilateral primary osteoarthritis, left knee: Secondary | ICD-10-CM | POA: Diagnosis not present

## 2018-11-21 DIAGNOSIS — F418 Other specified anxiety disorders: Secondary | ICD-10-CM | POA: Diagnosis not present

## 2018-11-21 DIAGNOSIS — G894 Chronic pain syndrome: Secondary | ICD-10-CM | POA: Diagnosis not present

## 2018-11-21 DIAGNOSIS — M1711 Unilateral primary osteoarthritis, right knee: Secondary | ICD-10-CM | POA: Diagnosis not present

## 2019-02-04 DIAGNOSIS — S30810A Abrasion of lower back and pelvis, initial encounter: Secondary | ICD-10-CM | POA: Diagnosis not present

## 2019-02-05 DIAGNOSIS — Z79899 Other long term (current) drug therapy: Secondary | ICD-10-CM | POA: Diagnosis not present

## 2019-02-05 DIAGNOSIS — F418 Other specified anxiety disorders: Secondary | ICD-10-CM | POA: Diagnosis not present

## 2019-02-05 DIAGNOSIS — M1711 Unilateral primary osteoarthritis, right knee: Secondary | ICD-10-CM | POA: Diagnosis not present

## 2019-02-05 DIAGNOSIS — G894 Chronic pain syndrome: Secondary | ICD-10-CM | POA: Diagnosis not present

## 2019-02-22 DIAGNOSIS — H35353 Cystoid macular degeneration, bilateral: Secondary | ICD-10-CM | POA: Diagnosis not present

## 2019-02-22 DIAGNOSIS — H2513 Age-related nuclear cataract, bilateral: Secondary | ICD-10-CM | POA: Diagnosis not present

## 2019-02-22 DIAGNOSIS — H25013 Cortical age-related cataract, bilateral: Secondary | ICD-10-CM | POA: Diagnosis not present

## 2019-02-22 DIAGNOSIS — H35033 Hypertensive retinopathy, bilateral: Secondary | ICD-10-CM | POA: Diagnosis not present

## 2019-02-27 DIAGNOSIS — H25812 Combined forms of age-related cataract, left eye: Secondary | ICD-10-CM | POA: Diagnosis not present

## 2019-02-27 DIAGNOSIS — H2512 Age-related nuclear cataract, left eye: Secondary | ICD-10-CM | POA: Diagnosis not present

## 2019-03-19 DIAGNOSIS — G8929 Other chronic pain: Secondary | ICD-10-CM | POA: Diagnosis not present

## 2019-03-19 DIAGNOSIS — I129 Hypertensive chronic kidney disease with stage 1 through stage 4 chronic kidney disease, or unspecified chronic kidney disease: Secondary | ICD-10-CM | POA: Diagnosis not present

## 2019-03-19 DIAGNOSIS — E1122 Type 2 diabetes mellitus with diabetic chronic kidney disease: Secondary | ICD-10-CM | POA: Diagnosis not present

## 2019-03-19 DIAGNOSIS — F319 Bipolar disorder, unspecified: Secondary | ICD-10-CM | POA: Diagnosis not present

## 2019-04-09 DIAGNOSIS — G894 Chronic pain syndrome: Secondary | ICD-10-CM | POA: Diagnosis not present

## 2019-04-09 DIAGNOSIS — M179 Osteoarthritis of knee, unspecified: Secondary | ICD-10-CM | POA: Diagnosis not present

## 2019-04-09 DIAGNOSIS — F418 Other specified anxiety disorders: Secondary | ICD-10-CM | POA: Diagnosis not present

## 2019-04-09 DIAGNOSIS — F3181 Bipolar II disorder: Secondary | ICD-10-CM | POA: Diagnosis not present

## 2019-04-24 DIAGNOSIS — H25011 Cortical age-related cataract, right eye: Secondary | ICD-10-CM | POA: Diagnosis not present

## 2019-04-24 DIAGNOSIS — H2511 Age-related nuclear cataract, right eye: Secondary | ICD-10-CM | POA: Diagnosis not present

## 2019-05-08 ENCOUNTER — Other Ambulatory Visit: Payer: Self-pay | Admitting: Family Medicine

## 2019-05-08 ENCOUNTER — Other Ambulatory Visit: Payer: Medicare Other

## 2019-06-13 ENCOUNTER — Other Ambulatory Visit: Payer: Self-pay

## 2019-06-13 ENCOUNTER — Ambulatory Visit
Admission: RE | Admit: 2019-06-13 | Discharge: 2019-06-13 | Disposition: A | Discharge status: Home / Self Care | Payer: Medicare Other | Source: Ambulatory Visit | Attending: Family Medicine | Admitting: Family Medicine

## 2019-06-13 DIAGNOSIS — R918 Other nonspecific abnormal finding of lung field: Secondary | ICD-10-CM | POA: Diagnosis not present

## 2019-06-13 DIAGNOSIS — I2510 Atherosclerotic heart disease of native coronary artery without angina pectoris: Secondary | ICD-10-CM | POA: Diagnosis not present

## 2019-06-13 DIAGNOSIS — R911 Solitary pulmonary nodule: Secondary | ICD-10-CM

## 2019-06-29 DIAGNOSIS — G894 Chronic pain syndrome: Secondary | ICD-10-CM | POA: Diagnosis not present

## 2019-06-29 DIAGNOSIS — F3181 Bipolar II disorder: Secondary | ICD-10-CM | POA: Diagnosis not present

## 2019-06-29 DIAGNOSIS — M1711 Unilateral primary osteoarthritis, right knee: Secondary | ICD-10-CM | POA: Diagnosis not present

## 2019-06-29 DIAGNOSIS — F418 Other specified anxiety disorders: Secondary | ICD-10-CM | POA: Diagnosis not present

## 2019-08-15 DIAGNOSIS — Z23 Encounter for immunization: Secondary | ICD-10-CM | POA: Diagnosis not present

## 2019-08-27 DIAGNOSIS — H3554 Dystrophies primarily involving the retinal pigment epithelium: Secondary | ICD-10-CM | POA: Diagnosis not present

## 2019-08-27 DIAGNOSIS — H2640 Unspecified secondary cataract: Secondary | ICD-10-CM | POA: Diagnosis not present

## 2019-08-27 DIAGNOSIS — H35033 Hypertensive retinopathy, bilateral: Secondary | ICD-10-CM | POA: Diagnosis not present

## 2019-08-27 DIAGNOSIS — H04123 Dry eye syndrome of bilateral lacrimal glands: Secondary | ICD-10-CM | POA: Diagnosis not present

## 2019-09-12 DIAGNOSIS — F418 Other specified anxiety disorders: Secondary | ICD-10-CM | POA: Diagnosis not present

## 2019-09-12 DIAGNOSIS — M25561 Pain in right knee: Secondary | ICD-10-CM | POA: Diagnosis not present

## 2019-09-12 DIAGNOSIS — Z79891 Long term (current) use of opiate analgesic: Secondary | ICD-10-CM | POA: Diagnosis not present

## 2019-09-12 DIAGNOSIS — F3181 Bipolar II disorder: Secondary | ICD-10-CM | POA: Diagnosis not present

## 2019-09-12 DIAGNOSIS — G894 Chronic pain syndrome: Secondary | ICD-10-CM | POA: Diagnosis not present

## 2019-10-15 DIAGNOSIS — H26493 Other secondary cataract, bilateral: Secondary | ICD-10-CM | POA: Diagnosis not present

## 2019-10-15 DIAGNOSIS — H35033 Hypertensive retinopathy, bilateral: Secondary | ICD-10-CM | POA: Diagnosis not present

## 2019-10-15 DIAGNOSIS — H35373 Puckering of macula, bilateral: Secondary | ICD-10-CM | POA: Diagnosis not present

## 2019-10-15 DIAGNOSIS — H3554 Dystrophies primarily involving the retinal pigment epithelium: Secondary | ICD-10-CM | POA: Diagnosis not present

## 2019-11-14 ENCOUNTER — Other Ambulatory Visit: Payer: Self-pay | Admitting: Family Medicine

## 2019-11-14 DIAGNOSIS — Z1231 Encounter for screening mammogram for malignant neoplasm of breast: Secondary | ICD-10-CM

## 2019-12-05 DIAGNOSIS — E663 Overweight: Secondary | ICD-10-CM | POA: Diagnosis not present

## 2019-12-05 DIAGNOSIS — N183 Chronic kidney disease, stage 3 unspecified: Secondary | ICD-10-CM | POA: Diagnosis not present

## 2019-12-05 DIAGNOSIS — E1122 Type 2 diabetes mellitus with diabetic chronic kidney disease: Secondary | ICD-10-CM | POA: Diagnosis not present

## 2019-12-05 DIAGNOSIS — I129 Hypertensive chronic kidney disease with stage 1 through stage 4 chronic kidney disease, or unspecified chronic kidney disease: Secondary | ICD-10-CM | POA: Diagnosis not present

## 2019-12-10 DIAGNOSIS — G894 Chronic pain syndrome: Secondary | ICD-10-CM | POA: Diagnosis not present

## 2019-12-10 DIAGNOSIS — F418 Other specified anxiety disorders: Secondary | ICD-10-CM | POA: Diagnosis not present

## 2019-12-10 DIAGNOSIS — M1711 Unilateral primary osteoarthritis, right knee: Secondary | ICD-10-CM | POA: Diagnosis not present

## 2019-12-10 DIAGNOSIS — F3181 Bipolar II disorder: Secondary | ICD-10-CM | POA: Diagnosis not present

## 2019-12-12 DIAGNOSIS — E782 Mixed hyperlipidemia: Secondary | ICD-10-CM | POA: Diagnosis not present

## 2019-12-12 DIAGNOSIS — E1122 Type 2 diabetes mellitus with diabetic chronic kidney disease: Secondary | ICD-10-CM | POA: Diagnosis not present

## 2019-12-12 DIAGNOSIS — Z7984 Long term (current) use of oral hypoglycemic drugs: Secondary | ICD-10-CM | POA: Diagnosis not present

## 2020-01-26 ENCOUNTER — Ambulatory Visit: Payer: Medicare Other | Attending: Internal Medicine

## 2020-01-26 DIAGNOSIS — Z23 Encounter for immunization: Secondary | ICD-10-CM | POA: Insufficient documentation

## 2020-01-26 NOTE — Progress Notes (Signed)
   Covid-19 Vaccination Clinic  Name:  Renee Snyder    MRN: [id number] DOB: 1956/02/10  01/26/2020  [name] was observed post Covid-19 immunization for 15 minutes without incidence. She was provided with Vaccine Information Sheet and instruction to access the V-Safe system.   [name] was instructed to call 911 with any severe reactions post vaccine: Marland Kitchen Difficulty breathing  . Swelling of your face and throat  . A fast heartbeat  . A bad rash all over your body  . Dizziness and weakness    Immunizations Administered    Name Date Dose VIS Date Route   Pfizer COVID-19 Vaccine 01/26/2020  8:44 AM 0.3 mL 11/23/2019 Intramuscular   Manufacturer: [employer], [employer]   Lot: [id number]   NDC: [address]-3

## 2020-02-13 DIAGNOSIS — F418 Other specified anxiety disorders: Secondary | ICD-10-CM | POA: Diagnosis not present

## 2020-02-13 DIAGNOSIS — G894 Chronic pain syndrome: Secondary | ICD-10-CM | POA: Diagnosis not present

## 2020-02-13 DIAGNOSIS — F3181 Bipolar II disorder: Secondary | ICD-10-CM | POA: Diagnosis not present

## 2020-02-13 DIAGNOSIS — M1711 Unilateral primary osteoarthritis, right knee: Secondary | ICD-10-CM | POA: Diagnosis not present

## 2020-02-17 ENCOUNTER — Ambulatory Visit: Payer: Medicare Other | Attending: Internal Medicine

## 2020-02-17 DIAGNOSIS — Z23 Encounter for immunization: Secondary | ICD-10-CM | POA: Insufficient documentation

## 2020-02-17 NOTE — Progress Notes (Signed)
   Covid-19 Vaccination Clinic  Name:  Renee Snyder    MRN: [id number] DOB: [birth year]  02/17/2020  Renee Snyder was observed post Covid-19 immunization for 15 minutes without incident. She was provided with Vaccine Information Sheet and instruction to access the V-Safe system.   Renee Snyder was instructed to call 911 with any severe reactions post vaccine: Marland Kitchen Difficulty breathing  . Swelling of face and throat  . A fast heartbeat  . A bad rash all over body  . Dizziness and weakness   Immunizations Administered    Name Date Dose VIS Date Route   Pfizer COVID-19 Vaccine 02/17/2020 10:40 AM 0.3 mL 11/23/2019 Intramuscular   Manufacturer: [employer], [employer]   Lot: [id number]   NDC: [address]-9

## 2020-02-27 IMAGING — CT CT CHEST WITHOUT CONTRAST
1 series · 15 of 34 positions shown, 19 images · non-contrast
Comparison: 05/30/2017, 11/09/2016

CLINICAL DATA: Follow-up pulmonary nodules

EXAM:
CT CHEST WITHOUT CONTRAST
TECHNIQUE: Multidetector CT imaging of the chest was performed following the
standard protocol without IV contrast.

[Series 2: chest w/(date) · axial · 0.69mm/px · z∈[-301,-43]mm · 15 of 153 slices shown, 19 images]
[im 12/153  mediastinal]
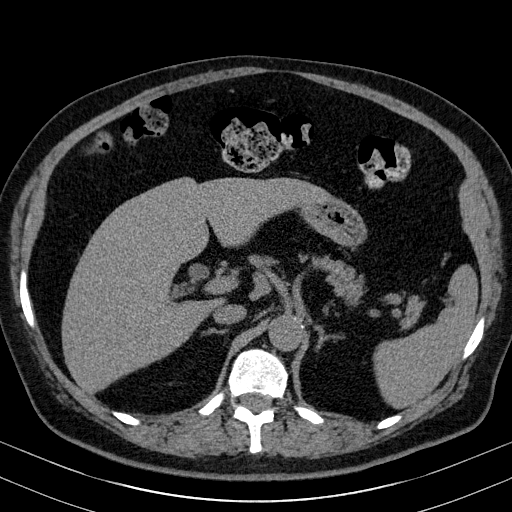
[im 12/153  lung]
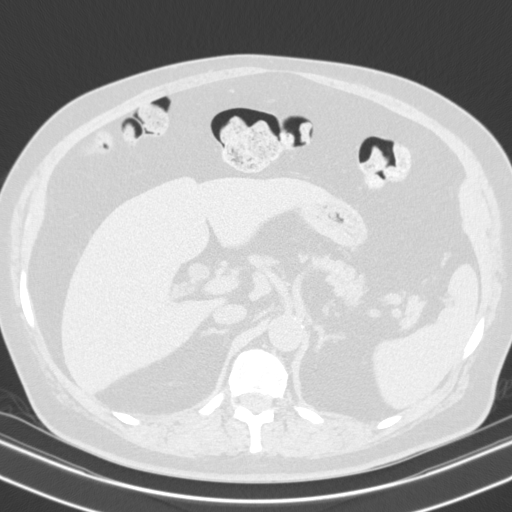
[im 23/153  lung]
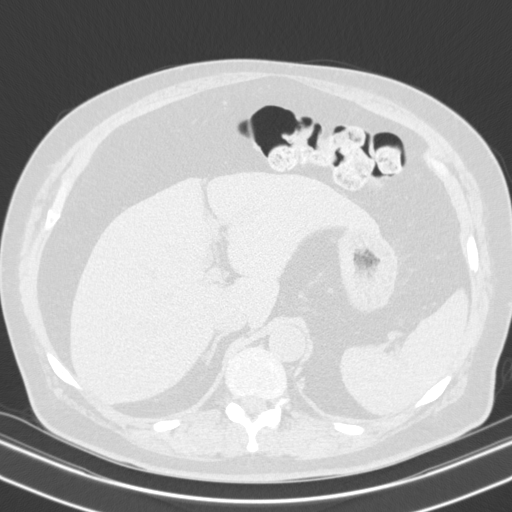
[im 31/153  lung]
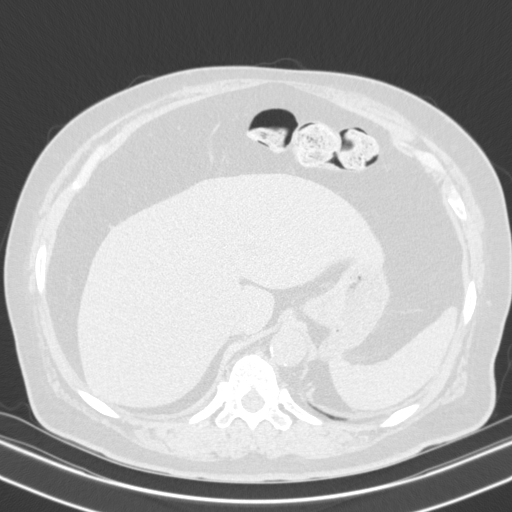
[im 40/153  lung]
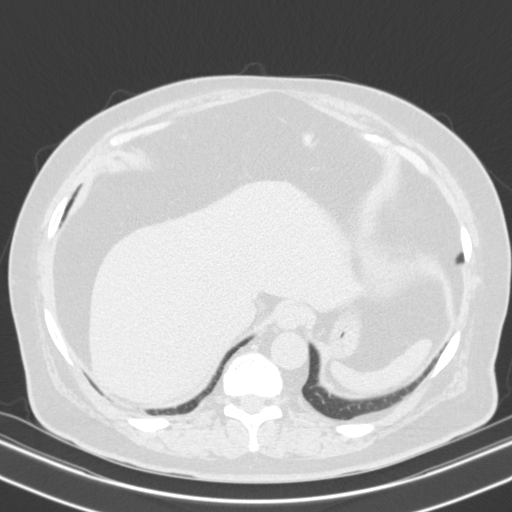
[im 51/153  mediastinal]
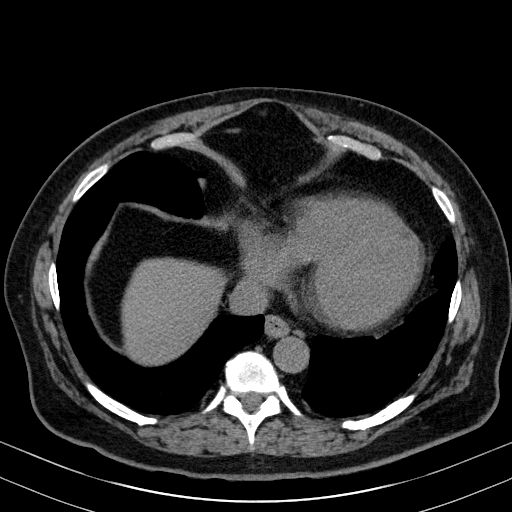
[im 51/153  lung]
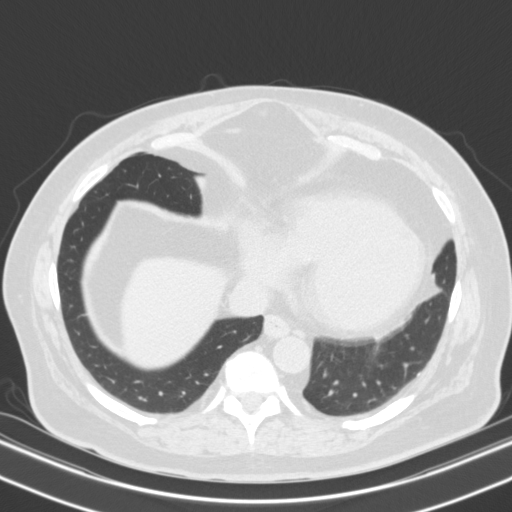
[im 61/153  lung]
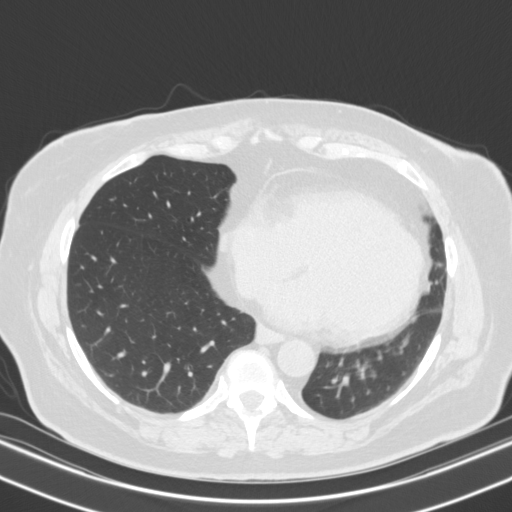
[im 68/153  lung]
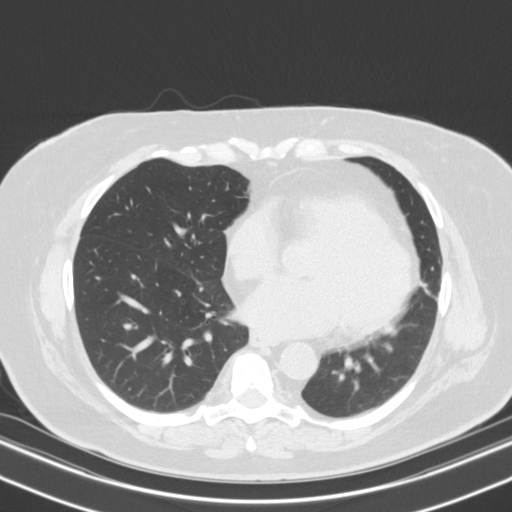
[im 79/153  lung]
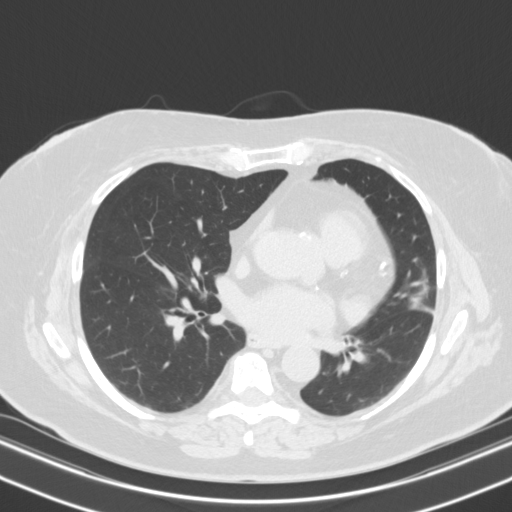
[im 85/153  mediastinal]
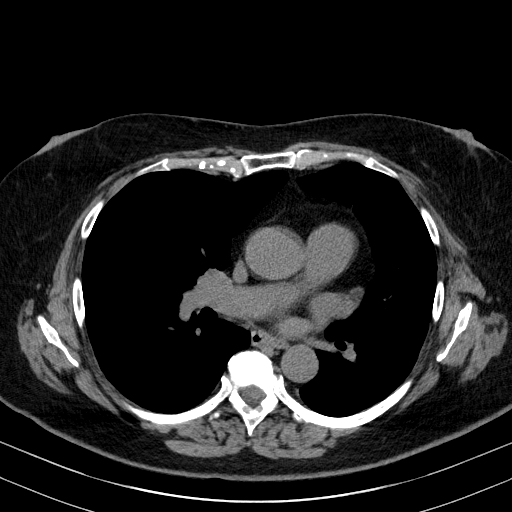
[im 85/153  lung]
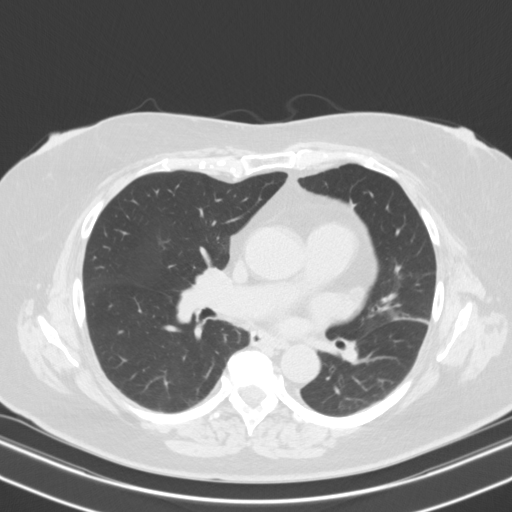
[im 92/153  lung]
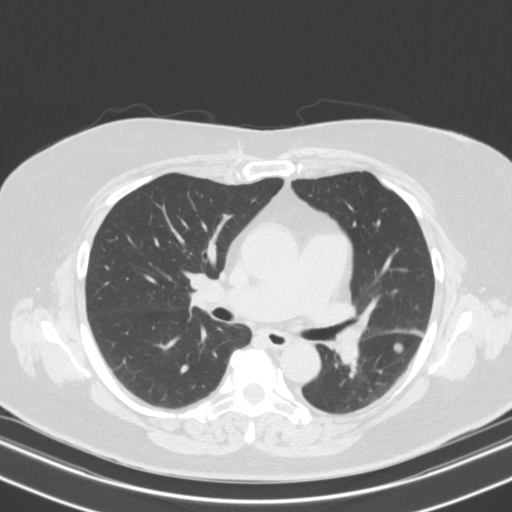
[im 102/153  lung]
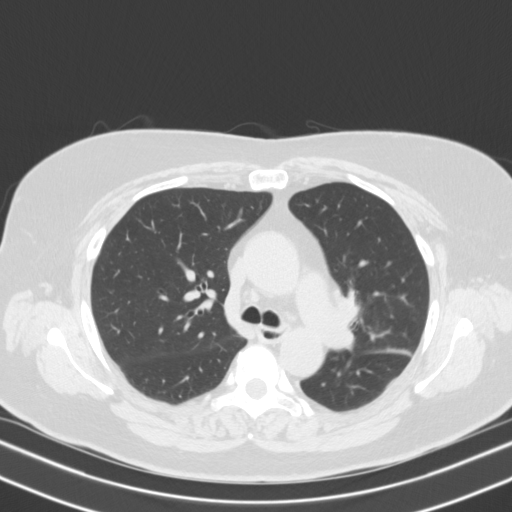
[im 113/153  lung]
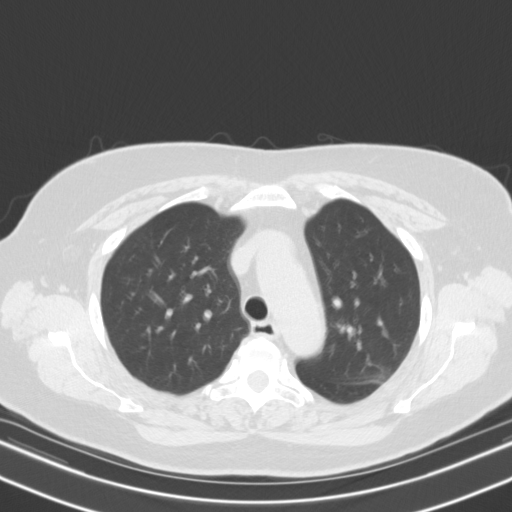
[im 122/153  mediastinal]
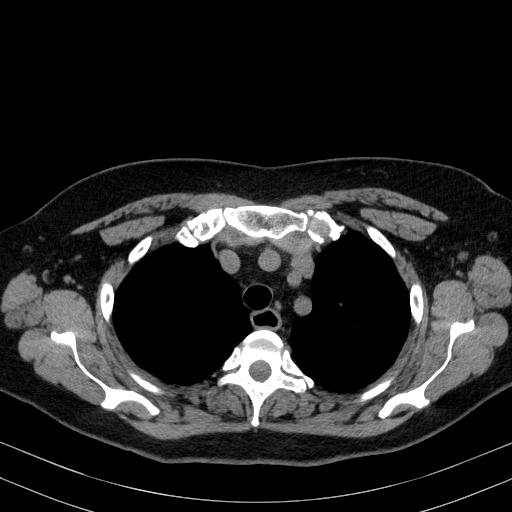
[im 122/153  lung]
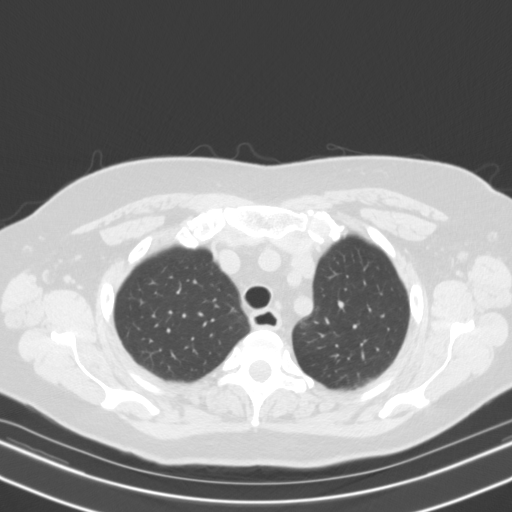
[im 130/153  lung]
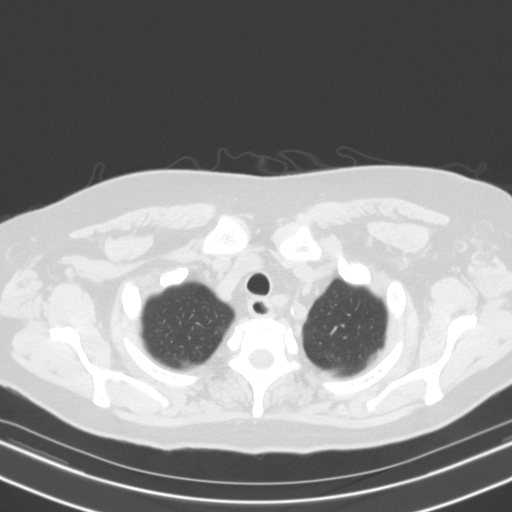
[im 141/153  lung]
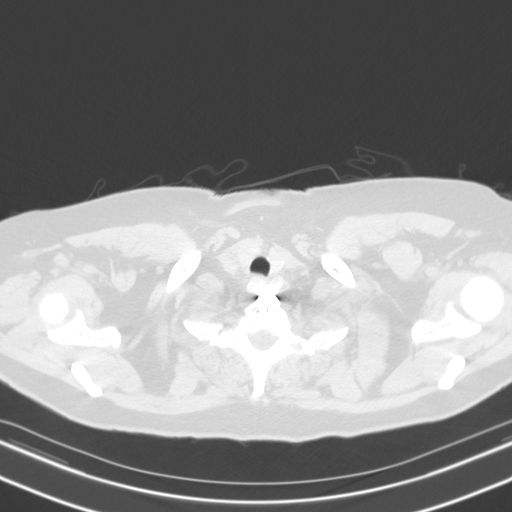

[15 of 34 positions shown; findings below may reference images not displayed]

FINDINGS: Cardiovascular: Three-vessel coronary artery calcifications. Mild
cardiomegaly. No pericardial effusion.

Mediastinum/Nodes: No enlarged mediastinal, hilar, or axillary lymph
nodes. Thyroid gland, trachea, and esophagus demonstrate no
significant findings.

Lungs/Pleura: Stable, benign pulmonary nodules of the superior
segment left lower lobe measuring 7 mm (series 3, image 61) and of
the right middle lobe measuring 5 mm (series 3, image 72). Mild,
bandlike scarring of the lingula unchanged from prior. No pleural
effusion or pneumothorax.

Upper Abdomen: No acute abnormality.

Musculoskeletal: No chest wall mass or suspicious bone lesions
identified.
IMPRESSION: 1. Small pulmonary nodules stable since examination dated 11/09/2016
and benign. No further routine CT follow-up is required.

2.  Coronary artery disease.

## 2020-03-13 DIAGNOSIS — H35033 Hypertensive retinopathy, bilateral: Secondary | ICD-10-CM | POA: Diagnosis not present

## 2020-03-13 DIAGNOSIS — H35373 Puckering of macula, bilateral: Secondary | ICD-10-CM | POA: Diagnosis not present

## 2020-03-13 DIAGNOSIS — H3554 Dystrophies primarily involving the retinal pigment epithelium: Secondary | ICD-10-CM | POA: Diagnosis not present

## 2020-03-13 DIAGNOSIS — H26492 Other secondary cataract, left eye: Secondary | ICD-10-CM | POA: Diagnosis not present

## 2020-04-18 DIAGNOSIS — M170 Bilateral primary osteoarthritis of knee: Secondary | ICD-10-CM | POA: Diagnosis not present

## 2020-04-18 DIAGNOSIS — G894 Chronic pain syndrome: Secondary | ICD-10-CM | POA: Diagnosis not present

## 2020-04-18 DIAGNOSIS — F3181 Bipolar II disorder: Secondary | ICD-10-CM | POA: Diagnosis not present

## 2020-04-18 DIAGNOSIS — F418 Other specified anxiety disorders: Secondary | ICD-10-CM | POA: Diagnosis not present

## 2020-05-13 ENCOUNTER — Ambulatory Visit (INDEPENDENT_AMBULATORY_CARE_PROVIDER_SITE_OTHER): Payer: Medicare Other | Admitting: Ophthalmology

## 2020-05-13 ENCOUNTER — Other Ambulatory Visit: Payer: Self-pay

## 2020-05-13 ENCOUNTER — Encounter (INDEPENDENT_AMBULATORY_CARE_PROVIDER_SITE_OTHER): Payer: Self-pay | Admitting: Ophthalmology

## 2020-05-13 DIAGNOSIS — H3581 Retinal edema: Secondary | ICD-10-CM

## 2020-05-13 DIAGNOSIS — H35373 Puckering of macula, bilateral: Secondary | ICD-10-CM

## 2020-05-13 DIAGNOSIS — H353131 Nonexudative age-related macular degeneration, bilateral, early dry stage: Secondary | ICD-10-CM | POA: Diagnosis not present

## 2020-05-13 DIAGNOSIS — H3540 Unspecified peripheral retinal degeneration: Secondary | ICD-10-CM

## 2020-05-13 DIAGNOSIS — I10 Essential (primary) hypertension: Secondary | ICD-10-CM

## 2020-05-13 DIAGNOSIS — Z961 Presence of intraocular lens: Secondary | ICD-10-CM

## 2020-05-13 DIAGNOSIS — H35033 Hypertensive retinopathy, bilateral: Secondary | ICD-10-CM

## 2020-05-13 NOTE — Progress Notes (Addendum)
[facility] Note  05/13/2020     CHIEF COMPLAINT Patient presents for Retina Evaluation   HISTORY OF PRESENT ILLNESS: Renee Snyder is a 64 y.o. female who presents to the clinic today for:   HPI    Retina Evaluation    In both eyes.  This started months ago.  Duration of months.  Associated Symptoms Negative for Flashes, Distortion, Pain, Photophobia, Trauma, Jaw Claudication, Fever, Fatigue, Floaters, Blind Spot, Redness, Glare, Scalp Tenderness, Shoulder/Hip pain and Weight Loss.  Context:  distance vision, mid-range vision and near vision.  I, the attending physician,  performed the HPI with the patient and updated documentation appropriately.          Comments    Retina eval per Dr. Herbert Deaner.  Pt has noticed fluctuating vision OU x few months now.  Pt had cataract sx OD 2020 OS 2021 (6 weeks ago).  She also Px laser Tx OU.  Dr. Herbert Deaner did not explained why she was coming to our office.   Pt has been on Metformin x several years but she never knew she was diabetic until recently.  A1C: 6.7 She does not check her own BS.         Last edited by Renee Caffey, MD on 05/13/2020 11:57 PM. (History)     Patient states that she has had fluctuating vision OU for the past few months. Patient had cataract surgery in the right eye in 2020. Had cataract surgery about 6 weeks ago (in 2021). Both cataract surgeries were performed by Dr. Herbert Deaner. Referred by Dr. Herbert Deaner to eval ARMD.   Referring physician: Monna Fam, MD [place],  [place] [address]  HISTORICAL INFORMATION:   Selected notes from the MEDICAL RECORD NUMBER Referral from Dr. Herbert Deaner on 04/02/20 for baseline ret eval [facility] cc OD: 20/20 OS: 20/30-2 Wearing Rx: OD: -1.00+0.25x080                      OS: -1.00+1.25x065                      ADD: +2.25 MRx: OD: -1.25+0.25x110          OS: -2.00+2.00x075 IOPs 19 OU Pseudo OU, ERM OU, Htn Ret OU   CURRENT MEDICATIONS: No current  outpatient medications on file. (Ophthalmic Drugs)   No current facility-administered medications for this visit. (Ophthalmic Drugs)   Current Outpatient Medications (Other)  Medication Sig  . amLODipine (NORVASC) 5 MG tablet Take 5 mg by mouth daily.  Marland Kitchen atorvastatin (LIPITOR) 10 MG tablet Take by mouth daily.  . cholecalciferol (VITAMIN D) 1000 UNITS tablet Take 1,000 Units by mouth daily.  Marland Kitchen losartan (COZAAR) 100 MG tablet Take 100 mg by mouth daily.  . metoprolol succinate (TOPROL-XL) 100 MG 24 hr tablet Take 100 mg by mouth 2 (two) times daily. Take with or immediately following a meal.  . QUEtiapine Fumarate (SEROQUEL XR) 150 MG 24 hr tablet Take 150 mg by mouth at bedtime.  Marland Kitchen venlafaxine XR (EFFEXOR-XR) 150 MG 24 hr capsule Take 150 mg by mouth daily with breakfast.  . oxycodone (ROXICODONE) 30 MG immediate release tablet Take 30 mg by mouth every 4 (four) hours as needed for pain.  . rosuvastatin (CRESTOR) 20 MG tablet Take 20 mg by mouth daily.   No current facility-administered medications for this visit. (Other)      REVIEW OF SYSTEMS: ROS    Positive  for: Endocrine, Cardiovascular, Eyes   Negative for: Constitutional, Gastrointestinal, Neurological, Skin, Genitourinary, Musculoskeletal, HENT, Respiratory, Psychiatric, Allergic/Imm, Heme/Lymph   Last edited by Leonie Snyder, COA on 05/13/2020  2:20 PM. (History)       ALLERGIES No Known Allergies  PAST MEDICAL HISTORY Past Medical History:  Diagnosis Date  . Hypertension    Past Surgical History:  Procedure Laterality Date  . CATARACT EXTRACTION    . YAG LASER APPLICATION      FAMILY HISTORY History reviewed. No pertinent family history.  SOCIAL HISTORY Social History   Tobacco Use  . Smoking status: Never Smoker  . Smokeless tobacco: Never Used  Substance Use Topics  . Alcohol use: Yes    Comment: occas  . Drug use: No         OPHTHALMIC EXAM:  Base Eye Exam    Visual Acuity (Snellen - Linear)       Right Left   Dist [place] 20/25 +2 20/30   Dist ph [place] NI NI   Correction: Glasses       Tonometry (Tonopen, 2:37 PM)      Right Left   Pressure 16 17       Pupils      Dark Light Shape React APD   Right 3 2 Round Brisk None   Left 3 2 Round Brisk None       Visual Fields (Counting fingers)      Left Right    Full Full       Extraocular Movement      Right Left    Full Full       Neuro/Psych    Oriented x3: Yes   Mood/Affect: Normal       Dilation    Both eyes: 1.0% Mydriacyl, 2.5% Phenylephrine @ 2:37 PM        Slit Lamp and Fundus Exam    Slit Lamp Exam      Right Left   Lids/Lashes Dermatochalasis - upper lid, mild MGD Dermatochalasis - upper lid, mild MGD   Conjunctiva/Sclera white and quiet white and quiet   Cornea mild arcus, well-healed cataract wounmd mild arcus, well-healed cataract wounmd   Anterior Chamber deep and clear deep and clear   Iris round and dilated, no NVI round and dilated, no NVI   Lens well centered PCIOL, open PC well centered PCIOL, open PC   Vitreous syneresis, PVD syneresis, PVD       Fundus Exam      Right Left   Disc mild pallor, sharp rim mild pallor, sharp rim   C/D Ratio 0.2 0.2   Macula flat, blunted foveal reflex, +drusen, RPE mottling and clumping, trace ERM, no heme or edema flat, blunted foveal reflex, +drusen, RPE mottling and clumping, trace ERM, no heme or edema   Vessels Vascular attenuation, mildly Tortuous Vascular attenuation, mildly Tortuous   Periphery attached, reticular degeneration, scattered focal punctate atrophic/depigmented lesions--greatest nasally--cystoid degen attached, reticular degeneration, reticular degeneration, scattered focal punctate atrophic/depigmented lesions--greatest superotemporally--cystoid degen        Refraction    Wearing Rx      Sphere Cylinder Axis Add   Right -1.25 +0.50 115 +2.50   Left -0.75 +1.25 083 +2.50       Manifest Refraction      Sphere Cylinder Axis Dist [facility]    Right -1.25 +0.75 105 20/20-   Left -1.00 +1.25 075 20/20          IMAGING AND PROCEDURES  Imaging  and Procedures for @TODAY @  OCT, Retina - OU - Both Eyes       Right Eye Quality was good. Central Foveal Thickness: 308. Progression has no prior data. Findings include normal foveal contour, no SRF, no IRF, retinal drusen , epiretinal membrane (Mild vitelliform like lesion centrally).   Left Eye Quality was good. Central Foveal Thickness: 319. Progression has no prior data. Findings include normal foveal contour, no SRF, no IRF, retinal drusen , epiretinal membrane (Mild vitilleform like lesion centrally).   Notes *Images captured and stored on drive  Diagnosis / Impression:  Mild non-exudative ARMD OU Mild vitelliform like lesions centrally OU Mild ERM OU  Clinical management:  See below  Abbreviations: NFP - Normal foveal profile. CME - cystoid macular edema. PED - pigment epithelial detachment. IRF - intraretinal fluid. SRF - subretinal fluid. EZ - ellipsoid zone. ERM - epiretinal membrane. ORA - outer retinal atrophy. ORT - outer retinal tubulation. SRHM - subretinal hyper-reflective material                 ASSESSMENT/PLAN:    ICD-10-CM   1. Early dry stage nonexudative age-related macular degeneration of both eyes  H35.3131   2. Retinal edema  H35.81 OCT, Retina - OU - Both Eyes  3. Epiretinal membrane (ERM) of both eyes  H35.373   4. Peripheral reticular degeneration of both eyes  H35.40   5. Essential hypertension  I10   6. Hypertensive retinopathy of both eyes  H35.033   7. Pseudophakia of both eyes  Z96.1     1,2. Age related macular degeneration, non-exudative, both eyes  - OCT shows mild vitelliform-like lesions centrally OU  - The incidence, anatomy, and pathology of dry AMD, risk of progression, and the AREDS and AREDS 2 study including smoking risks discussed with patient.  - Recommend amsler grid monitoring  - f/u 3-4 months  3. ERM OU  -  The natural history, anatomy, potential for loss of vision, and treatment options including vitrectomy techniques and the complications of endophthalmitis, retinal detachment, vitreous hemorrhage, cataract progression and permanent vision loss discussed with the patient.  - mild ERMs OU  - asymptomatic, no metamorphopsia  - no indication for surgery at this time  - monitor for now  - f/u 3-4 mos  4. Peripheral cystoid and reticular degeneration OU  - peripheral RPE changes  - discussed findings and prognosis  - monitor   5,6. Hypertensive retinopathy OU - discussed importance of tight BP control - monitor  7. Pseudophakia OU  - s/p CE/IOL OU Herbert Deaner)  - s/p YAG OU  - beautiful surgeries, doing well  - BCVA 20/20 OU  - under the expert management of Dr. Herbert Deaner  Ophthalmic Meds Ordered this visit:  No orders of the defined types were placed in this encounter.      Return 3-4 months, for DFE, OCT.  There are no Patient Instructions on file for this visit.   Explained the diagnoses, plan, and follow up with the patient and they expressed understanding.  Patient expressed understanding of the importance of proper follow up care.  This document serves as a record of services personally performed by Gardiner Sleeper, MD, PhD. It was created on their behalf by Roselee Nova, COMT. The creation of this record is the provider's dictation and/or activities during the visit.  Electronically signed by: Roselee Nova, COMT 05/14/20 12:06 AM  Gardiner Sleeper, M.D., Ph.D. Diseases & Surgery of the Retina and Vitreous [facility]  I have reviewed the above documentation for accuracy and completeness, and I agree with the above. Gardiner Sleeper, M.D., Ph.D. 05/14/20 12:06 AM   Abbreviations: M myopia (nearsighted); A astigmatism; H hyperopia (farsighted); P presbyopia; Mrx spectacle prescription;  CTL contact lenses; OD right eye; OS left eye; OU both eyes  XT  exotropia; ET esotropia; PEK punctate epithelial keratitis; PEE punctate epithelial erosions; DES dry eye syndrome; MGD meibomian gland dysfunction; ATs artificial tears; PFAT's preservative free artificial tears; [place] nuclear sclerotic cataract; PSC posterior subcapsular cataract; ERM epi-retinal membrane; PVD posterior vitreous detachment; RD retinal detachment; DM diabetes mellitus; DR diabetic retinopathy; NPDR non-proliferative diabetic retinopathy; PDR proliferative diabetic retinopathy; CSME clinically significant macular edema; DME diabetic macular edema; dbh dot blot hemorrhages; CWS cotton wool spot; POAG primary open angle glaucoma; C/D cup-to-disc ratio; HVF humphrey visual field; GVF goldmann visual field; OCT optical coherence tomography; IOP intraocular pressure; BRVO Branch retinal vein occlusion; CRVO central retinal vein occlusion; CRAO central retinal artery occlusion; BRAO branch retinal artery occlusion; RT retinal tear; SB scleral buckle; PPV pars plana vitrectomy; VH Vitreous hemorrhage; PRP panretinal laser photocoagulation; IVK intravitreal kenalog; VMT vitreomacular traction; MH Macular hole;  NVD neovascularization of the disc; NVE neovascularization elsewhere; AREDS age related eye disease study; ARMD age related macular degeneration; POAG primary open angle glaucoma; EBMD epithelial/anterior basement membrane dystrophy; ACIOL anterior chamber intraocular lens; IOL intraocular lens; PCIOL posterior chamber intraocular lens; Phaco/IOL phacoemulsification with intraocular lens placement; [place] photorefractive keratectomy; LASIK laser assisted in situ keratomileusis; HTN hypertension; DM diabetes mellitus; COPD chronic obstructive pulmonary disease

## 2020-05-14 ENCOUNTER — Encounter (INDEPENDENT_AMBULATORY_CARE_PROVIDER_SITE_OTHER): Payer: Self-pay | Admitting: Ophthalmology

## 2020-05-20 DIAGNOSIS — M25561 Pain in right knee: Secondary | ICD-10-CM | POA: Diagnosis not present

## 2020-05-20 DIAGNOSIS — F418 Other specified anxiety disorders: Secondary | ICD-10-CM | POA: Diagnosis not present

## 2020-05-20 DIAGNOSIS — F3181 Bipolar II disorder: Secondary | ICD-10-CM | POA: Diagnosis not present

## 2020-05-20 DIAGNOSIS — G894 Chronic pain syndrome: Secondary | ICD-10-CM | POA: Diagnosis not present

## 2020-07-02 DIAGNOSIS — M179 Osteoarthritis of knee, unspecified: Secondary | ICD-10-CM | POA: Diagnosis not present

## 2020-07-02 DIAGNOSIS — F418 Other specified anxiety disorders: Secondary | ICD-10-CM | POA: Diagnosis not present

## 2020-07-02 DIAGNOSIS — G894 Chronic pain syndrome: Secondary | ICD-10-CM | POA: Diagnosis not present

## 2020-07-02 DIAGNOSIS — F3181 Bipolar II disorder: Secondary | ICD-10-CM | POA: Diagnosis not present

## 2020-08-13 DIAGNOSIS — M179 Osteoarthritis of knee, unspecified: Secondary | ICD-10-CM | POA: Diagnosis not present

## 2020-08-13 DIAGNOSIS — F3181 Bipolar II disorder: Secondary | ICD-10-CM | POA: Diagnosis not present

## 2020-08-13 DIAGNOSIS — F418 Other specified anxiety disorders: Secondary | ICD-10-CM | POA: Diagnosis not present

## 2020-08-13 DIAGNOSIS — Z79891 Long term (current) use of opiate analgesic: Secondary | ICD-10-CM | POA: Diagnosis not present

## 2020-08-13 DIAGNOSIS — G894 Chronic pain syndrome: Secondary | ICD-10-CM | POA: Diagnosis not present

## 2020-08-15 DIAGNOSIS — Z23 Encounter for immunization: Secondary | ICD-10-CM | POA: Diagnosis not present

## 2020-08-15 DIAGNOSIS — N183 Chronic kidney disease, stage 3 unspecified: Secondary | ICD-10-CM | POA: Diagnosis not present

## 2020-08-15 DIAGNOSIS — Z1211 Encounter for screening for malignant neoplasm of colon: Secondary | ICD-10-CM | POA: Diagnosis not present

## 2020-08-15 DIAGNOSIS — Z6831 Body mass index (BMI) 31.0-31.9, adult: Secondary | ICD-10-CM | POA: Diagnosis not present

## 2020-08-20 ENCOUNTER — Other Ambulatory Visit (HOSPITAL_BASED_OUTPATIENT_CLINIC_OR_DEPARTMENT_OTHER): Payer: Self-pay | Admitting: Family Medicine

## 2020-08-20 DIAGNOSIS — N289 Disorder of kidney and ureter, unspecified: Secondary | ICD-10-CM

## 2020-08-21 ENCOUNTER — Other Ambulatory Visit: Payer: Self-pay

## 2020-08-21 ENCOUNTER — Ambulatory Visit (INDEPENDENT_AMBULATORY_CARE_PROVIDER_SITE_OTHER): Payer: Medicare Other

## 2020-08-21 DIAGNOSIS — N1330 Unspecified hydronephrosis: Secondary | ICD-10-CM | POA: Diagnosis not present

## 2020-08-21 DIAGNOSIS — N289 Disorder of kidney and ureter, unspecified: Secondary | ICD-10-CM | POA: Diagnosis not present

## 2020-08-22 DIAGNOSIS — Z6831 Body mass index (BMI) 31.0-31.9, adult: Secondary | ICD-10-CM | POA: Diagnosis not present

## 2020-08-22 DIAGNOSIS — Z23 Encounter for immunization: Secondary | ICD-10-CM | POA: Diagnosis not present

## 2020-08-22 DIAGNOSIS — E1122 Type 2 diabetes mellitus with diabetic chronic kidney disease: Secondary | ICD-10-CM | POA: Diagnosis not present

## 2020-08-22 DIAGNOSIS — Z1211 Encounter for screening for malignant neoplasm of colon: Secondary | ICD-10-CM | POA: Diagnosis not present

## 2020-08-23 ENCOUNTER — Ambulatory Visit (HOSPITAL_BASED_OUTPATIENT_CLINIC_OR_DEPARTMENT_OTHER): Payer: Medicare Other

## 2020-08-23 ENCOUNTER — Other Ambulatory Visit (HOSPITAL_BASED_OUTPATIENT_CLINIC_OR_DEPARTMENT_OTHER): Payer: Medicare Other

## 2020-09-02 ENCOUNTER — Encounter (INDEPENDENT_AMBULATORY_CARE_PROVIDER_SITE_OTHER): Payer: Medicare Other | Admitting: Ophthalmology

## 2020-11-17 DIAGNOSIS — G894 Chronic pain syndrome: Secondary | ICD-10-CM | POA: Diagnosis not present

## 2020-11-17 DIAGNOSIS — M25561 Pain in right knee: Secondary | ICD-10-CM | POA: Diagnosis not present

## 2020-11-17 DIAGNOSIS — F3181 Bipolar II disorder: Secondary | ICD-10-CM | POA: Diagnosis not present

## 2020-11-17 DIAGNOSIS — F418 Other specified anxiety disorders: Secondary | ICD-10-CM | POA: Diagnosis not present

## 2021-01-19 DIAGNOSIS — G894 Chronic pain syndrome: Secondary | ICD-10-CM | POA: Diagnosis not present

## 2021-01-19 DIAGNOSIS — F418 Other specified anxiety disorders: Secondary | ICD-10-CM | POA: Diagnosis not present

## 2021-01-19 DIAGNOSIS — M179 Osteoarthritis of knee, unspecified: Secondary | ICD-10-CM | POA: Diagnosis not present

## 2021-01-19 DIAGNOSIS — F3181 Bipolar II disorder: Secondary | ICD-10-CM | POA: Diagnosis not present

## 2021-02-18 DIAGNOSIS — N183 Chronic kidney disease, stage 3 unspecified: Secondary | ICD-10-CM | POA: Diagnosis not present

## 2021-02-18 DIAGNOSIS — R519 Headache, unspecified: Secondary | ICD-10-CM | POA: Diagnosis not present

## 2021-02-18 DIAGNOSIS — I129 Hypertensive chronic kidney disease with stage 1 through stage 4 chronic kidney disease, or unspecified chronic kidney disease: Secondary | ICD-10-CM | POA: Diagnosis not present

## 2021-02-18 DIAGNOSIS — Z6831 Body mass index (BMI) 31.0-31.9, adult: Secondary | ICD-10-CM | POA: Diagnosis not present

## 2021-05-29 DIAGNOSIS — M1711 Unilateral primary osteoarthritis, right knee: Secondary | ICD-10-CM | POA: Diagnosis not present

## 2021-05-29 DIAGNOSIS — M25361 Other instability, right knee: Secondary | ICD-10-CM | POA: Diagnosis not present

## 2021-05-29 DIAGNOSIS — M25561 Pain in right knee: Secondary | ICD-10-CM | POA: Diagnosis not present

## 2021-06-01 DIAGNOSIS — G894 Chronic pain syndrome: Secondary | ICD-10-CM | POA: Diagnosis not present

## 2021-06-01 DIAGNOSIS — Z79899 Other long term (current) drug therapy: Secondary | ICD-10-CM | POA: Diagnosis not present

## 2021-06-01 DIAGNOSIS — F418 Other specified anxiety disorders: Secondary | ICD-10-CM | POA: Diagnosis not present

## 2021-06-01 DIAGNOSIS — F3181 Bipolar II disorder: Secondary | ICD-10-CM | POA: Diagnosis not present

## 2021-06-01 DIAGNOSIS — M170 Bilateral primary osteoarthritis of knee: Secondary | ICD-10-CM | POA: Diagnosis not present

## 2021-06-24 DIAGNOSIS — M25461 Effusion, right knee: Secondary | ICD-10-CM | POA: Diagnosis not present

## 2021-06-24 DIAGNOSIS — M25561 Pain in right knee: Secondary | ICD-10-CM | POA: Diagnosis not present

## 2021-06-24 DIAGNOSIS — M1711 Unilateral primary osteoarthritis, right knee: Secondary | ICD-10-CM | POA: Diagnosis not present

## 2021-07-02 DIAGNOSIS — M1711 Unilateral primary osteoarthritis, right knee: Secondary | ICD-10-CM | POA: Diagnosis not present

## 2021-07-02 DIAGNOSIS — M25561 Pain in right knee: Secondary | ICD-10-CM | POA: Diagnosis not present

## 2021-07-09 DIAGNOSIS — M1711 Unilateral primary osteoarthritis, right knee: Secondary | ICD-10-CM | POA: Diagnosis not present

## 2021-07-09 DIAGNOSIS — M25561 Pain in right knee: Secondary | ICD-10-CM | POA: Diagnosis not present

## 2021-08-07 DIAGNOSIS — E1122 Type 2 diabetes mellitus with diabetic chronic kidney disease: Secondary | ICD-10-CM | POA: Diagnosis not present

## 2021-08-07 DIAGNOSIS — I129 Hypertensive chronic kidney disease with stage 1 through stage 4 chronic kidney disease, or unspecified chronic kidney disease: Secondary | ICD-10-CM | POA: Diagnosis not present

## 2021-08-07 DIAGNOSIS — R222 Localized swelling, mass and lump, trunk: Secondary | ICD-10-CM | POA: Diagnosis not present

## 2021-08-07 DIAGNOSIS — E782 Mixed hyperlipidemia: Secondary | ICD-10-CM | POA: Diagnosis not present

## 2021-08-12 DIAGNOSIS — M179 Osteoarthritis of knee, unspecified: Secondary | ICD-10-CM | POA: Diagnosis not present

## 2021-08-12 DIAGNOSIS — G894 Chronic pain syndrome: Secondary | ICD-10-CM | POA: Diagnosis not present

## 2021-08-12 DIAGNOSIS — F3181 Bipolar II disorder: Secondary | ICD-10-CM | POA: Diagnosis not present

## 2021-08-12 DIAGNOSIS — Z79899 Other long term (current) drug therapy: Secondary | ICD-10-CM | POA: Diagnosis not present

## 2021-08-31 DIAGNOSIS — Z1211 Encounter for screening for malignant neoplasm of colon: Secondary | ICD-10-CM | POA: Diagnosis not present

## 2021-08-31 DIAGNOSIS — Z0000 Encounter for general adult medical examination without abnormal findings: Secondary | ICD-10-CM | POA: Diagnosis not present

## 2021-08-31 DIAGNOSIS — Z23 Encounter for immunization: Secondary | ICD-10-CM | POA: Diagnosis not present

## 2021-09-01 ENCOUNTER — Other Ambulatory Visit: Payer: Self-pay | Admitting: Family Medicine

## 2021-09-01 DIAGNOSIS — Z1231 Encounter for screening mammogram for malignant neoplasm of breast: Secondary | ICD-10-CM

## 2021-09-01 DIAGNOSIS — E2839 Other primary ovarian failure: Secondary | ICD-10-CM

## 2021-09-24 ENCOUNTER — Other Ambulatory Visit: Payer: Self-pay

## 2021-09-24 ENCOUNTER — Ambulatory Visit
Admission: RE | Admit: 2021-09-24 | Discharge: 2021-09-24 | Disposition: A | Discharge status: Home / Self Care | Payer: Medicare Other | Source: Ambulatory Visit | Attending: Family Medicine | Admitting: Family Medicine

## 2021-09-24 DIAGNOSIS — Z1231 Encounter for screening mammogram for malignant neoplasm of breast: Secondary | ICD-10-CM

## 2021-09-24 DIAGNOSIS — E2839 Other primary ovarian failure: Secondary | ICD-10-CM

## 2021-09-24 DIAGNOSIS — Z780 Asymptomatic menopausal state: Secondary | ICD-10-CM | POA: Diagnosis not present

## 2021-10-12 DIAGNOSIS — F3181 Bipolar II disorder: Secondary | ICD-10-CM | POA: Diagnosis not present

## 2021-10-12 DIAGNOSIS — M170 Bilateral primary osteoarthritis of knee: Secondary | ICD-10-CM | POA: Diagnosis not present

## 2021-10-12 DIAGNOSIS — Z79899 Other long term (current) drug therapy: Secondary | ICD-10-CM | POA: Diagnosis not present

## 2021-10-12 DIAGNOSIS — G894 Chronic pain syndrome: Secondary | ICD-10-CM | POA: Diagnosis not present

## 2021-12-09 DIAGNOSIS — M1711 Unilateral primary osteoarthritis, right knee: Secondary | ICD-10-CM | POA: Diagnosis not present

## 2021-12-11 DIAGNOSIS — Z79899 Other long term (current) drug therapy: Secondary | ICD-10-CM | POA: Diagnosis not present

## 2021-12-11 DIAGNOSIS — F3181 Bipolar II disorder: Secondary | ICD-10-CM | POA: Diagnosis not present

## 2021-12-11 DIAGNOSIS — G894 Chronic pain syndrome: Secondary | ICD-10-CM | POA: Diagnosis not present

## 2021-12-11 DIAGNOSIS — M1711 Unilateral primary osteoarthritis, right knee: Secondary | ICD-10-CM | POA: Diagnosis not present

## 2021-12-30 DIAGNOSIS — M1711 Unilateral primary osteoarthritis, right knee: Secondary | ICD-10-CM | POA: Diagnosis not present

## 2021-12-30 DIAGNOSIS — Z01818 Encounter for other preprocedural examination: Secondary | ICD-10-CM | POA: Diagnosis not present

## 2022-01-14 DIAGNOSIS — Z7984 Long term (current) use of oral hypoglycemic drugs: Secondary | ICD-10-CM | POA: Diagnosis not present

## 2022-01-14 DIAGNOSIS — M1711 Unilateral primary osteoarthritis, right knee: Secondary | ICD-10-CM | POA: Diagnosis not present

## 2022-01-14 DIAGNOSIS — M25561 Pain in right knee: Secondary | ICD-10-CM | POA: Diagnosis not present

## 2022-01-14 DIAGNOSIS — G8918 Other acute postprocedural pain: Secondary | ICD-10-CM | POA: Diagnosis not present

## 2022-01-14 DIAGNOSIS — M24561 Contracture, right knee: Secondary | ICD-10-CM | POA: Diagnosis not present

## 2022-01-14 DIAGNOSIS — E785 Hyperlipidemia, unspecified: Secondary | ICD-10-CM | POA: Diagnosis not present

## 2022-01-14 DIAGNOSIS — E119 Type 2 diabetes mellitus without complications: Secondary | ICD-10-CM | POA: Diagnosis not present

## 2022-01-14 DIAGNOSIS — Z7982 Long term (current) use of aspirin: Secondary | ICD-10-CM | POA: Diagnosis not present

## 2022-01-14 DIAGNOSIS — F319 Bipolar disorder, unspecified: Secondary | ICD-10-CM | POA: Diagnosis not present

## 2022-01-14 DIAGNOSIS — K219 Gastro-esophageal reflux disease without esophagitis: Secondary | ICD-10-CM | POA: Diagnosis not present

## 2022-01-14 DIAGNOSIS — M1712 Unilateral primary osteoarthritis, left knee: Secondary | ICD-10-CM | POA: Diagnosis not present

## 2022-01-14 DIAGNOSIS — M21161 Varus deformity, not elsewhere classified, right knee: Secondary | ICD-10-CM | POA: Diagnosis not present

## 2022-01-14 DIAGNOSIS — I10 Essential (primary) hypertension: Secondary | ICD-10-CM | POA: Diagnosis not present

## 2022-01-15 DIAGNOSIS — M24561 Contracture, right knee: Secondary | ICD-10-CM | POA: Diagnosis not present

## 2022-01-15 DIAGNOSIS — K219 Gastro-esophageal reflux disease without esophagitis: Secondary | ICD-10-CM | POA: Diagnosis not present

## 2022-01-15 DIAGNOSIS — E785 Hyperlipidemia, unspecified: Secondary | ICD-10-CM | POA: Diagnosis not present

## 2022-01-15 DIAGNOSIS — F319 Bipolar disorder, unspecified: Secondary | ICD-10-CM | POA: Diagnosis not present

## 2022-01-15 DIAGNOSIS — Z7982 Long term (current) use of aspirin: Secondary | ICD-10-CM | POA: Diagnosis not present

## 2022-01-15 DIAGNOSIS — E119 Type 2 diabetes mellitus without complications: Secondary | ICD-10-CM | POA: Diagnosis not present

## 2022-01-15 DIAGNOSIS — Z7984 Long term (current) use of oral hypoglycemic drugs: Secondary | ICD-10-CM | POA: Diagnosis not present

## 2022-01-15 DIAGNOSIS — M21161 Varus deformity, not elsewhere classified, right knee: Secondary | ICD-10-CM | POA: Diagnosis not present

## 2022-01-15 DIAGNOSIS — M1711 Unilateral primary osteoarthritis, right knee: Secondary | ICD-10-CM | POA: Diagnosis not present

## 2022-01-15 DIAGNOSIS — I10 Essential (primary) hypertension: Secondary | ICD-10-CM | POA: Diagnosis not present

## 2022-01-18 ENCOUNTER — Ambulatory Visit: Payer: Medicare Other | Admitting: Physical Therapy

## 2022-01-19 ENCOUNTER — Ambulatory Visit: Payer: Medicare Other | Attending: Orthopedic Surgery | Admitting: Physical Therapy

## 2022-01-19 DIAGNOSIS — M6281 Muscle weakness (generalized): Secondary | ICD-10-CM | POA: Insufficient documentation

## 2022-01-19 DIAGNOSIS — M25561 Pain in right knee: Secondary | ICD-10-CM | POA: Insufficient documentation

## 2022-01-19 DIAGNOSIS — R600 Localized edema: Secondary | ICD-10-CM | POA: Insufficient documentation

## 2022-01-19 DIAGNOSIS — R262 Difficulty in walking, not elsewhere classified: Secondary | ICD-10-CM | POA: Insufficient documentation

## 2022-01-22 DIAGNOSIS — F319 Bipolar disorder, unspecified: Secondary | ICD-10-CM | POA: Diagnosis not present

## 2022-01-22 DIAGNOSIS — Z7984 Long term (current) use of oral hypoglycemic drugs: Secondary | ICD-10-CM | POA: Diagnosis not present

## 2022-01-22 DIAGNOSIS — F418 Other specified anxiety disorders: Secondary | ICD-10-CM | POA: Diagnosis not present

## 2022-01-22 DIAGNOSIS — K5900 Constipation, unspecified: Secondary | ICD-10-CM | POA: Diagnosis not present

## 2022-01-22 DIAGNOSIS — Z471 Aftercare following joint replacement surgery: Secondary | ICD-10-CM | POA: Diagnosis not present

## 2022-01-22 DIAGNOSIS — E119 Type 2 diabetes mellitus without complications: Secondary | ICD-10-CM | POA: Diagnosis not present

## 2022-01-22 DIAGNOSIS — F3181 Bipolar II disorder: Secondary | ICD-10-CM | POA: Diagnosis not present

## 2022-01-22 DIAGNOSIS — Z79899 Other long term (current) drug therapy: Secondary | ICD-10-CM | POA: Diagnosis not present

## 2022-01-22 DIAGNOSIS — K219 Gastro-esophageal reflux disease without esophagitis: Secondary | ICD-10-CM | POA: Diagnosis not present

## 2022-01-22 DIAGNOSIS — Z7982 Long term (current) use of aspirin: Secondary | ICD-10-CM | POA: Diagnosis not present

## 2022-01-22 DIAGNOSIS — I10 Essential (primary) hypertension: Secondary | ICD-10-CM | POA: Diagnosis not present

## 2022-01-22 DIAGNOSIS — G894 Chronic pain syndrome: Secondary | ICD-10-CM | POA: Diagnosis not present

## 2022-01-22 DIAGNOSIS — E785 Hyperlipidemia, unspecified: Secondary | ICD-10-CM | POA: Diagnosis not present

## 2022-01-22 DIAGNOSIS — Z96651 Presence of right artificial knee joint: Secondary | ICD-10-CM | POA: Diagnosis not present

## 2022-02-03 DIAGNOSIS — Z4889 Encounter for other specified surgical aftercare: Secondary | ICD-10-CM | POA: Diagnosis not present

## 2022-02-09 ENCOUNTER — Other Ambulatory Visit: Payer: Self-pay

## 2022-02-09 ENCOUNTER — Encounter: Payer: Self-pay | Admitting: Physical Therapy

## 2022-02-09 ENCOUNTER — Ambulatory Visit: Payer: Medicare Other | Admitting: Physical Therapy

## 2022-02-09 DIAGNOSIS — R262 Difficulty in walking, not elsewhere classified: Secondary | ICD-10-CM

## 2022-02-09 DIAGNOSIS — M25561 Pain in right knee: Secondary | ICD-10-CM | POA: Diagnosis not present

## 2022-02-09 DIAGNOSIS — M6281 Muscle weakness (generalized): Secondary | ICD-10-CM | POA: Diagnosis not present

## 2022-02-09 DIAGNOSIS — R600 Localized edema: Secondary | ICD-10-CM | POA: Diagnosis not present

## 2022-02-09 NOTE — Therapy (Signed)
[facility]. [place], [place], [address] Phone: [phone]   Fax:  [phone]  Physical Therapy Evaluation  Patient Details  Name: Renee Snyder MRN: [id number] Date of Birth: 26-Aug-1956 Referring Provider (PT): Wandra Mannan   Encounter Date: 02/09/2022   PT End of Session - 02/09/22 1454     Visit Number 1    Number of Visits 19    Date for PT Re-Evaluation 03/23/22    Authorization Type BCBS    Authorization Time Period 02/09/22 to 03/23/22    PT Start Time 1405    PT Stop Time 1443    PT Time Calculation (min) 38 min    Activity Tolerance Patient tolerated treatment well    Behavior During Therapy [facility] for tasks assessed/performed             Past Medical History:  Diagnosis Date   Hypertension     Past Surgical History:  Procedure Laterality Date   CATARACT EXTRACTION     YAG LASER APPLICATION      There were no vitals filed for this visit.    Subjective Assessment - 02/09/22 1407     Subjective I had a complete TKR February 2nd at a hospital in [place]. I had HHPT for about 4 weeks. When I get up from a laying or sitting position its hard for me to get the first 3-4 steps going. After I do a couple of circles at the house I have better mobility. No falls but had some close calls if I'd not had my walker.    Patient Stated Goals get knee back to full function/ROM and walk as far as I can see, want to be able to take a trip out [place] this summer in RV    Currently in Pain? Yes    Pain Score 7     Pain Location Knee    Pain Orientation Right    Pain Descriptors / Indicators Aching;Dull;Stabbing;Burning    Pain Type Acute pain;Surgical pain    Pain Radiating Towards stays in knee but wants to radiate    Pain Onset 1 to 4 weeks ago    Pain Frequency Constant    Aggravating Factors  letting knee get stiff, first few steps of movement    Pain Relieving Factors pain meds, ice, exercise     Effect of Pain on Daily Activities moderate                [facility] PT Assessment - 02/09/22 0001       Assessment   Medical Diagnosis s/p R TKR    Referring Provider (PT) Wandra Mannan    Onset Date/Surgical Date 01/14/22    Next MD Visit Dr. Melvyn Novas March 28th    Prior Therapy HHPT for TKR      Precautions   Precautions None      Restrictions   Weight Bearing Restrictions Yes    Other Position/Activity Restrictions WBAT      Balance Screen   Has the patient fallen in the past 6 months No    Has the patient had a decrease in activity level because of a fear of falling?  Yes    Is the patient reluctant to leave their home because of a fear of falling?  No      Prior Function   Level of Independence Independent;Independent with basic ADLs    Vocation Retired    Leisure reading, walking/hiking  Observation/Other Assessments   Focus on Therapeutic Outcomes (FOTO)  33.6      ROM / Strength   AROM / PROM / Strength AROM;Strength      AROM   AROM Assessment Site Knee    Right/Left Knee Right    Right Knee Extension 14    Right Knee Flexion 101      Strength   Strength Assessment Site Hip;Knee;Ankle    Right/Left Hip Right;Left    Right Hip Flexion 3/5    Right Hip ABduction 4/5    Left Hip Flexion 4+/5    Left Hip ABduction 3+/5    Right/Left Knee Right;Left    Right Knee Flexion 4/5    Right Knee Extension 3/5    Left Knee Flexion 4+/5    Left Knee Extension 4+/5    Right/Left Ankle Left;Right    Right Ankle Dorsiflexion 5/5    Left Ankle Dorsiflexion 5/5      Transfers   Five time sit to stand comments  18.3 seconds      High Level Balance   High Level Balance Comments TUG 15 no device                        Objective measurements completed on examination: See above findings.       [facility] Adult PT Treatment/Exercise - 02/09/22 0001       Exercises   Exercises Knee/Hip      Knee/Hip Exercises: Supine   Quad Sets Right;1 set;15  reps    Quad Sets Limitations 3 second holds    Short Arc [employer] Right;1 set;10 reps    Short Arc Quad Sets Limitations 3            SLRs with quad set 1x10 R LE          PT Education - 02/09/22 1453     Education Details exam findings, HEP, POC    Person(s) Educated Patient;Spouse    Methods Explanation    Comprehension Verbalized understanding              PT Short Term Goals - 02/09/22 1456       PT SHORT TERM GOAL #1   Title Will be independent with appropriate progressive HEP    Time 3    Period Weeks    Status New    Target Date 03/02/22      PT SHORT TERM GOAL #2   Title R knee extension ROM to be no greater than 5 degrees, flexion ROM to be no less than 110 degrees    Time 3    Period Weeks    Status New      PT SHORT TERM GOAL #3   Title Will be able to complete TUG in 12 seconds and 5x STS in less than 14 seconds    Time 3    Period Weeks    Status New      PT SHORT TERM GOAL #4   Title Will be independent in appropriate edema management strategies    Time 3    Period Weeks    Status New               PT Long Term Goals - 02/09/22 1459       PT LONG TERM GOAL #1   Title MMT to improve by one grade in all weak groups    Time 6    Period Weeks  Status New    Target Date 03/23/22      PT LONG TERM GOAL #2   Title Will be able to ambulate unlimited commuinty distances with high quality gait pattern including equal stride/step lengths and consistent heel toe pattern no device    Time 6    Period Weeks    Status New      PT LONG TERM GOAL #3   Title Will be able to complete at least 4 steps with U UE support and reciprocal pattern    Time 6    Period Weeks    Status New      PT LONG TERM GOAL #4   Title Will return to gym based exercise program to maintain functional gains and prevent backsliding    Time 6    Period Weeks    Status New      PT LONG TERM GOAL #5   Title FOTO score to improve by at least 25 points     Time 6    Period Weeks    Status New                    Plan - 02/09/22 1454     Clinical Impression Statement Lilia Pro arrives today doing well, had R TKR on 01/14/22 in [place], received HHPT and is now coming to OP for further care. Exam is typical for someone s/p R TKR- biggest problems right now are limited knee extension and flexion ROM, mild gait impairment, functional muscle weakness, and impaired functional balance and mobility as expected after this surgery. Will benefit from skilled PT services to address functional limitations and optimize overall level of function moving forward.    Personal Factors and Comorbidities Fitness    Examination-Activity Limitations Locomotion Level;Transfers;Sit;Squat;Stairs;Stand    Examination-Participation Restrictions Community Activity;Yard Work    Stability/Clinical Decision Making Stable/Uncomplicated    Designer, jewellery Low    Rehab Potential Good    PT Frequency 3x / week    PT Duration 6 weeks    PT Treatment/Interventions ADLs/Self Care Home Management;Cryotherapy;Electrical Stimulation;Iontophoresis 4mg /ml Dexamethasone;Moist Heat;Ultrasound;DME Instruction;Stair training;Functional mobility training;Gait training;Therapeutic activities;Therapeutic exercise;Balance training;Manual techniques;Neuromuscular re-education;Patient/family education;Passive range of motion;Dry needling;Energy conservation;Taping    PT Next Visit Plan focus on knee ROM and strength, gait and balance training, edema management    PT Home Exercise Plan [id number]    Consulted and Agree with Plan of Care Patient             Patient will benefit from skilled therapeutic intervention in order to improve the following deficits and impairments:  Abnormal gait, Decreased range of motion, Difficulty walking, Increased muscle spasms, Decreased activity tolerance, Decreased skin integrity, Pain, Decreased balance, Hypomobility, Impaired flexibility,  Decreased scar mobility, Decreased mobility, Decreased strength, Increased edema  Visit Diagnosis: Acute pain of right knee  Muscle weakness (generalized)  Difficulty in walking, not elsewhere classified  Localized edema     Problem List Patient Active Problem List   Diagnosis Date Noted   New electrocardiogram abnormalities consistent with ischemia noted during preoperative cardiovascular examination 02/08/2014   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 [place], [place], [address] Phone: [phone]   Fax:  [phone]  Name: Swathi Socks MRN: [id number] Date  of Birth: 01-Sep-1956

## 2022-02-16 ENCOUNTER — Ambulatory Visit: Payer: Medicare Other | Admitting: Physical Therapy

## 2022-02-18 ENCOUNTER — Ambulatory Visit: Payer: Medicare Other | Admitting: Physical Therapy

## 2022-02-22 ENCOUNTER — Ambulatory Visit: Payer: Medicare Other | Admitting: Physical Therapy

## 2022-02-23 ENCOUNTER — Ambulatory Visit: Payer: Medicare Other | Admitting: Physical Therapy

## 2022-02-24 ENCOUNTER — Ambulatory Visit: Payer: Medicare Other | Admitting: Physical Therapy

## 2022-03-01 ENCOUNTER — Other Ambulatory Visit: Payer: Self-pay

## 2022-03-01 ENCOUNTER — Ambulatory Visit: Payer: Medicare Other | Attending: Orthopedic Surgery | Admitting: Physical Therapy

## 2022-03-01 ENCOUNTER — Encounter: Payer: Self-pay | Admitting: Physical Therapy

## 2022-03-01 DIAGNOSIS — R600 Localized edema: Secondary | ICD-10-CM | POA: Insufficient documentation

## 2022-03-01 DIAGNOSIS — M25561 Pain in right knee: Secondary | ICD-10-CM | POA: Insufficient documentation

## 2022-03-01 DIAGNOSIS — M6281 Muscle weakness (generalized): Secondary | ICD-10-CM | POA: Insufficient documentation

## 2022-03-01 DIAGNOSIS — R262 Difficulty in walking, not elsewhere classified: Secondary | ICD-10-CM | POA: Insufficient documentation

## 2022-03-01 NOTE — Therapy (Signed)
[facility] [address] ?[place], [place], [address] ?Phone: [phone]   Fax:  [phone] ? ?Physical Therapy Treatment ? ?Patient Details  ?Name: Renee Snyder ?MRN: [id number] ?Date of Birth: 1956-04-04 ?Referring Provider (PT): Wandra Mannan ? ? ?Encounter Date: 03/01/2022 ? ? PT End of Session - 03/01/22 1206   ? ? Visit Number 2   ? Number of Visits 19   ? Date for PT Re-Evaluation 03/23/22   ? Authorization Type BCBS   ? Authorization Time Period 02/09/22 to 03/23/22   ? PT Start Time 1108   ? PT Stop Time 1147   ? PT Time Calculation (min) 39 min   ? Activity Tolerance Patient tolerated treatment well   ? Behavior During Therapy [facility] for tasks assessed/performed   ? ?  ?  ? ?  ? ? ?Past Medical History:  ?Diagnosis Date  ? Hypertension   ? ? ?Past Surgical History:  ?Procedure Laterality Date  ? CATARACT EXTRACTION    ? YAG LASER APPLICATION    ? ? ?There were no vitals filed for this visit. ? ? Subjective Assessment - 03/01/22 1110   ? ? Subjective I've been having a really really hard time managing my pain even though I am taking the prescription meds for pain as prescribed. 7/10 is the best it gets recently. I feel like our floors are slippery to the point it is treacherous. The pain is on the outside of my leg down into my calf   ? Patient Stated Goals get knee back to full function/ROM and walk as far as I can see, want to be able to take a trip out [place] this summer in RV   ? Currently in Pain? Yes   ? Pain Score 7    ? Pain Location Knee   ? Pain Orientation Right   ? Pain Descriptors / Indicators Aching;Dull;Stabbing   ? Pain Type Acute pain;Surgical pain   ? ?  ?  ? ?  ? ? ? ? ? ? ? ? ? ? ? ? ? ? ? ? ? ? ? ? [facility] Adult PT Treatment/Exercise - 03/01/22 0001   ? ?  ? Knee/Hip Exercises: Aerobic  ? Recumbent Bike x6 minutes on bike for knee ROM   ?  ? Knee/Hip Exercises: Standing  ? Heel Raises Both;1 set;10 reps   ? Lateral Step Up Right;1  set;10 reps;Hand Hold: 1;Step Height: 4"   ? Forward Step Up Right;1 set;10 reps;Hand Hold: 2;Step Height: 4"   ? Step Down Both;1 set;10 reps;Step Height: 4"   ? Step Down Limitations mod-max cues for good form   ? Gait Training heel toe gait training x160ft min cues for form   ?  ? Manual Therapy  ? Manual Therapy Soft tissue mobilization   ? Soft tissue mobilization R lateral quad and peroneals IASTM   ? ?  ?  ? ?  ? ? ? ? ? ? ? ? ? ? PT Education - 03/01/22 1205   ? ? Education Details be open about discussing pain levels and management/meds with MD so they can adjust meds PRN, consider use of LRAD to offload knee and heelp reduce pain, education about DN   ? Person(s) Educated Patient   ? Methods Explanation   ? Comprehension Verbalized understanding   ? ?  ?  ? ?  ? ? ? PT Short Term Goals - 02/09/22 1456   ? ?  ?  PT SHORT TERM GOAL #1  ? Title Will be independent with appropriate progressive HEP   ? Time 3   ? Period Weeks   ? Status New   ? Target Date 03/02/22   ?  ? PT SHORT TERM GOAL #2  ? Title R knee extension ROM to be no greater than 5 degrees, flexion ROM to be no less than 110 degrees   ? Time 3   ? Period Weeks   ? Status New   ?  ? PT SHORT TERM GOAL #3  ? Title Will be able to complete TUG in 12 seconds and 5x STS in less than 14 seconds   ? Time 3   ? Period Weeks   ? Status New   ?  ? PT SHORT TERM GOAL #4  ? Title Will be independent in appropriate edema management strategies   ? Time 3   ? Period Weeks   ? Status New   ? ?  ?  ? ?  ? ? ? ? PT Long Term Goals - 02/09/22 1459   ? ?  ? PT LONG TERM GOAL #1  ? Title MMT to improve by one grade in all weak groups   ? Time 6   ? Period Weeks   ? Status New   ? Target Date 03/23/22   ?  ? PT LONG TERM GOAL #2  ? Title Will be able to ambulate unlimited commuinty distances with high quality gait pattern including equal stride/step lengths and consistent heel toe pattern no device   ? Time 6   ? Period Weeks   ? Status New   ?  ? PT LONG TERM GOAL #3   ? Title Will be able to complete at least 4 steps with U UE support and reciprocal pattern   ? Time 6   ? Period Weeks   ? Status New   ?  ? PT LONG TERM GOAL #4  ? Title Will return to gym based exercise program to maintain functional gains and prevent backsliding   ? Time 6   ? Period Weeks   ? Status New   ?  ? PT LONG TERM GOAL #5  ? Title FOTO score to improve by at least 25 points   ? Time 6   ? Period Weeks   ? Status New   ? ?  ?  ? ?  ? ? ? ? ? ? ? ? Plan - 03/01/22 1206   ? ? Clinical Impression Statement Ms. Sligh arrives today doing OK, having a lot of issues with pain management despite taking meds as prescribed by MD. We had a long conversation about pain management and use of LRAD to help take some pressure off of her knee, also encouraged her to call MD with concerns about pain management issues as well. I did find a lot of spasms and soft tissue limitations in R quad and peroneals and worked some out but she may very well benefit from DN moving forward. Otherwise worked on general ROM and strength. Will continue efforts as appropriate.   ? Personal Factors and Comorbidities Fitness   ? Examination-Activity Limitations Locomotion Level;Transfers;Sit;Squat;Stairs;Stand   ? Examination-Participation Restrictions Community Activity;Valla Leaver Work   ? Stability/Clinical Decision Making Stable/Uncomplicated   ? Clinical Decision Making Low   ? Rehab Potential Good   ? PT Frequency 3x / week   ? PT Duration 6 weeks   ? PT Treatment/Interventions ADLs/Self Care Home  Management;Cryotherapy;Electrical Stimulation;Iontophoresis 4mg /ml Dexamethasone;Moist Heat;Ultrasound;DME Instruction;Stair training;Functional mobility training;Gait training;Therapeutic activities;Therapeutic exercise;Balance training;Manual techniques;Neuromuscular re-education;Patient/family education;Passive range of motion;Dry needling;Energy conservation;Taping   ? PT Next Visit Plan focus on knee ROM and strength, gait and balance training,  edema management. Try DN   ? PT Home Exercise Plan [id number]   ? Consulted and Agree with Plan of Care Patient   ? ?  ?  ? ?  ? ? ?Patient will benefit from skilled therapeutic intervention in order to improve the following deficits and impairments:  Abnormal gait, Decreased range of motion, Difficulty walking, Increased muscle spasms, Decreased activity tolerance, Decreased skin integrity, Pain, Decreased balance, Hypomobility, Impaired flexibility, Decreased scar mobility, Decreased mobility, Decreased strength, Increased edema ? ?Visit Diagnosis: ?Acute pain of right knee ? ?Muscle weakness (generalized) ? ?Difficulty in walking, not elsewhere classified ? ?Localized edema ? ? ? ? ?Problem List ?Patient Active Problem List  ? Diagnosis Date Noted  ? New electrocardiogram abnormalities consistent with ischemia noted during preoperative cardiovascular examination 02/08/2014  ? 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 HTN (hypertension) 02/08/2014  ? Hyperlipidemia 02/08/2014  ? ?Iriana Artley U PT, DPT, PN2  ? ?Supplemental Physical Therapist ?[facility]  ? ? ? ? ? ?[facility] [address] ?[address] ?[place], [place], [address] ?Phone: [phone]   Fax:  [phone] ? ?Name: Codee Marturano ?MRN: [id number] ?Date of Birth: 04/30/1956 ? ? ? ?

## 2022-03-03 ENCOUNTER — Other Ambulatory Visit: Payer: Self-pay

## 2022-03-03 ENCOUNTER — Encounter: Payer: Self-pay | Admitting: Physical Therapy

## 2022-03-03 ENCOUNTER — Ambulatory Visit: Payer: Medicare Other | Admitting: Physical Therapy

## 2022-03-03 DIAGNOSIS — R600 Localized edema: Secondary | ICD-10-CM

## 2022-03-03 DIAGNOSIS — R262 Difficulty in walking, not elsewhere classified: Secondary | ICD-10-CM

## 2022-03-03 DIAGNOSIS — M6281 Muscle weakness (generalized): Secondary | ICD-10-CM | POA: Diagnosis not present

## 2022-03-03 DIAGNOSIS — M25561 Pain in right knee: Secondary | ICD-10-CM

## 2022-03-03 NOTE — Therapy (Signed)
[place] [facility] ?[address] ?[place], [place], [address] ?Phone: [phone]   Fax:  [phone] ? ?Physical Therapy Treatment ? ?Patient Details  ?Name: Renee Snyder ?MRN: [id number] ?Date of Birth: September 27, 1956 ?Referring Provider (PT): Cranston Neighbor ? ? ?Encounter Date: 03/03/2022 ? ? PT End of Session - 03/03/22 1100   ? ? Visit Number 3   ? Number of Visits 19   ? Date for PT Re-Evaluation 03/23/22   ? Authorization Type BCBS   ? Authorization Time Period 02/09/22 to 03/23/22   ? PT Start Time 1017   ? PT Stop Time 1057   ? PT Time Calculation (min) 40 min   ? Activity Tolerance Patient tolerated treatment well   ? Behavior During Therapy [facility] for tasks assessed/performed   ? ?  ?  ? ?  ? ? ?Past Medical History:  ?Diagnosis Date  ? Hypertension   ? ? ?Past Surgical History:  ?Procedure Laterality Date  ? CATARACT EXTRACTION    ? YAG LASER APPLICATION    ? ? ?There were no vitals filed for this visit. ? ? Subjective Assessment - 03/03/22 1019   ? ? Subjective I felt good after last session but the day after, but I had really bad pain the next day. I've been up since 3am due to pain today.   ? Patient Stated Goals get knee back to full function/ROM and walk as far as I can see, [place]   ? Currently in Pain? Yes   ? Pain Score 6    ? Pain Location Knee   ? Pain Orientation Right   ? Pain Descriptors / Indicators Tightness;Cramping   ? ?  ?  ? ?  ? ? ? ? ? [facility] PT Assessment - 03/03/22 0001   ? ?  ? AROM  ? Right Knee Extension 7   ? Right Knee Flexion 118   ? ?  ?  ? ?  ? ? ? ? ? ? ? ? ? ? ? ? ? ? ? ? [facility] Adult PT Treatment/Exercise - 03/03/22 0001   ? ?  ? Knee/Hip Exercises: Stretches  ? Active Hamstring Stretch Right;3 reps;30 seconds   ?  ? Knee/Hip Exercises: Aerobic  ? Recumbent Bike x6 minutes on bike for knee ROM   seat 10  ?  ? Knee/Hip Exercises: Standing  ? Heel Raises Both;1 set;15 reps   ? Lateral  Step Up Right;1 set;15 reps;Hand Hold: 1;Step Height: 4"   ? Forward Step Up Right;1 set;15 reps;Hand Hold: 1;Step Height: 4"   ?  ? Knee/Hip Exercises: Seated  ? Sit to Sand 2 sets;10 reps;without UE support   2 inch box under L LE for increased WB R LE  ?  ? Manual Therapy  ? Manual Therapy Soft tissue mobilization;Passive ROM;Joint mobilization   ? Joint Mobilization patella mobs all directions   ? Soft tissue mobilization R lateral quad and peroneals IASTM   ? Passive ROM overpressure for knee extension   ? ?  ?  ? ?  ? ? ? ? ? ? Balance Exercises - 03/03/22 0001   ? ?  ? Balance Exercises: Standing  ? Tandem Stance Eyes open;Foam/compliant surface;3 reps;30 secs   ? ?  ?  ? ?  ? ? ? ? ? PT Education - 03/03/22 1059   ? ? Education Details self massage  to R quad using tennis ball, moist heat on quad mm but not on knee/ice pack on knee only   ? Person(s) Educated Patient   ? Methods Explanation   ? Comprehension Verbalized understanding   ? ?  ?  ? ?  ? ? ? PT Short Term Goals - 02/09/22 1456   ? ?  ? PT SHORT TERM GOAL #1  ? Title Will be independent with appropriate progressive HEP   ? Time 3   ? Period Weeks   ? Status New   ? Target Date 03/02/22   ?  ? PT SHORT TERM GOAL #2  ? Title R knee extension ROM to be no greater than 5 degrees, flexion ROM to be no less than 110 degrees   ? Time 3   ? Period Weeks   ? Status New   ?  ? PT SHORT TERM GOAL #3  ? Title Will be able to complete TUG in 12 seconds and 5x STS in less than 14 seconds   ? Time 3   ? Period Weeks   ? Status New   ?  ? PT SHORT TERM GOAL #4  ? Title Will be independent in appropriate edema management strategies   ? Time 3   ? Period Weeks   ? Status New   ? ?  ?  ? ?  ? ? ? ? PT Long Term Goals - 02/09/22 1459   ? ?  ? PT LONG TERM GOAL #1  ? Title MMT to improve by one grade in all weak groups   ? Time 6   ? Period Weeks   ? Status New   ? Target Date 03/23/22   ?  ? PT LONG TERM GOAL #2  ? Title Will be able to ambulate unlimited commuinty  distances with high quality gait pattern including equal stride/step lengths and consistent heel toe pattern no device   ? Time 6   ? Period Weeks   ? Status New   ?  ? PT LONG TERM GOAL #3  ? Title Will be able to complete at least 4 steps with U UE support and reciprocal pattern   ? Time 6   ? Period Weeks   ? Status New   ?  ? PT LONG TERM GOAL #4  ? Title Will return to gym based exercise program to maintain functional gains and prevent backsliding   ? Time 6   ? Period Weeks   ? Status New   ?  ? PT LONG TERM GOAL #5  ? Title FOTO score to improve by at least 25 points   ? Time 6   ? Period Weeks   ? Status New   ? ?  ?  ? ?  ? ? ? ? ? ? ? ? Plan - 03/03/22 1100   ? ? Clinical Impression Statement Lilia Pro arrives today doing OK- still having a lot of issues with high pain levels but talking to her doctor via video visit soon. Warmed up on the bike, then worked on manual interventions for her knee. ROM is looking great- 7 degrees extension to 118 flexion. Otherwise worked on stretches and strengthening interventions as appropriate this morning. Will continue efforts.   ? Personal Factors and Comorbidities Fitness   ? Examination-Activity Limitations Locomotion Level;Transfers;Sit;Squat;Stairs;Stand   ? Examination-Participation Restrictions Community Activity;Valla Leaver Work   ? Stability/Clinical Decision Making Stable/Uncomplicated   ? Clinical Decision Making Low   ? Rehab  Potential Good   ? PT Frequency 3x / week   ? PT Duration 6 weeks   ? PT Treatment/Interventions ADLs/Self Care Home Management;Cryotherapy;Electrical Stimulation;Iontophoresis 4mg /ml Dexamethasone;Moist Heat;Ultrasound;DME Instruction;Stair training;Functional mobility training;Gait training;Therapeutic activities;Therapeutic exercise;Balance training;Manual techniques;Neuromuscular re-education;Patient/family education;Passive range of motion;Dry needling;Energy conservation;Taping   ? PT Next Visit Plan focus on knee ROM and strength, gait and  balance training, edema management. Try DN   ? PT Home Exercise Plan [id number]   ? Consulted and Agree with Plan of Care Patient   ? ?  ?  ? ?  ? ? ?Patient will benefit from skilled therapeutic intervention in order to improve the following deficits and impairments:  Abnormal gait, Decreased range of motion, Difficulty walking, Increased muscle spasms, Decreased activity tolerance, Decreased skin integrity, Pain, Decreased balance, Hypomobility, Impaired flexibility, Decreased scar mobility, Decreased mobility, Decreased strength, Increased edema ? ?Visit Diagnosis: ?Acute pain of right knee ? ?Difficulty in walking, not elsewhere classified ? ?Localized edema ? ?Muscle weakness (generalized) ? ? ? ? ?Problem List ?Patient Active Problem List  ? Diagnosis Date Noted  ? New electrocardiogram abnormalities consistent with ischemia noted during preoperative cardiovascular examination 02/08/2014  ? 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 HTN (hypertension) 02/08/2014  ? Hyperlipidemia 02/08/2014  ? ?Talicia Sui U PT, DPT, PN2  ? ?Supplemental Physical Therapist ?[facility]  ? ? ? ? ? ?[place] [address] ?[place], [place], [address] ?Phone: [phone]   Fax:  [phone] ? ?Name: Azoria Abts ?MRN: [id number] ?Date of Birth: 1956/08/09 ? ? ? ?

## 2022-03-08 ENCOUNTER — Ambulatory Visit: Payer: Medicare Other | Admitting: Physical Therapy

## 2022-03-11 ENCOUNTER — Ambulatory Visit: Payer: Medicare Other | Admitting: Physical Therapy

## 2022-03-12 DIAGNOSIS — F418 Other specified anxiety disorders: Secondary | ICD-10-CM | POA: Diagnosis not present

## 2022-03-12 DIAGNOSIS — G894 Chronic pain syndrome: Secondary | ICD-10-CM | POA: Diagnosis not present

## 2022-03-12 DIAGNOSIS — Z79899 Other long term (current) drug therapy: Secondary | ICD-10-CM | POA: Diagnosis not present

## 2022-03-12 DIAGNOSIS — F3181 Bipolar II disorder: Secondary | ICD-10-CM | POA: Diagnosis not present

## 2022-04-06 DIAGNOSIS — I129 Hypertensive chronic kidney disease with stage 1 through stage 4 chronic kidney disease, or unspecified chronic kidney disease: Secondary | ICD-10-CM | POA: Diagnosis not present

## 2022-04-06 DIAGNOSIS — E1122 Type 2 diabetes mellitus with diabetic chronic kidney disease: Secondary | ICD-10-CM | POA: Diagnosis not present

## 2022-04-06 DIAGNOSIS — F319 Bipolar disorder, unspecified: Secondary | ICD-10-CM | POA: Diagnosis not present

## 2022-04-06 DIAGNOSIS — E782 Mixed hyperlipidemia: Secondary | ICD-10-CM | POA: Diagnosis not present

## 2022-04-06 DIAGNOSIS — N183 Chronic kidney disease, stage 3 unspecified: Secondary | ICD-10-CM | POA: Diagnosis not present

## 2022-04-15 DIAGNOSIS — N183 Chronic kidney disease, stage 3 unspecified: Secondary | ICD-10-CM | POA: Diagnosis not present

## 2022-04-16 DIAGNOSIS — Z79899 Other long term (current) drug therapy: Secondary | ICD-10-CM | POA: Diagnosis not present

## 2022-04-16 DIAGNOSIS — Z5181 Encounter for therapeutic drug level monitoring: Secondary | ICD-10-CM | POA: Diagnosis not present

## 2022-04-16 DIAGNOSIS — G894 Chronic pain syndrome: Secondary | ICD-10-CM | POA: Diagnosis not present

## 2022-04-16 DIAGNOSIS — F418 Other specified anxiety disorders: Secondary | ICD-10-CM | POA: Diagnosis not present

## 2022-04-16 DIAGNOSIS — F3181 Bipolar II disorder: Secondary | ICD-10-CM | POA: Diagnosis not present

## 2022-06-10 IMAGING — MG MM DIGITAL SCREENING BILAT W/ TOMO AND CAD
6 of 10 series · 6 of 30 positions shown · non-contrast
Comparison: Previous exam(s).

CLINICAL DATA: Screening.

EXAM:
DIGITAL SCREENING BILATERAL MAMMOGRAM WITH TOMOSYNTHESIS AND CAD
TECHNIQUE: Bilateral screening digital craniocaudal and mediolateral oblique
mammograms were obtained. Bilateral screening digital breast
tomosynthesis was performed. The images were evaluated with
computer-aided detection.

[R MLO synth-2D]
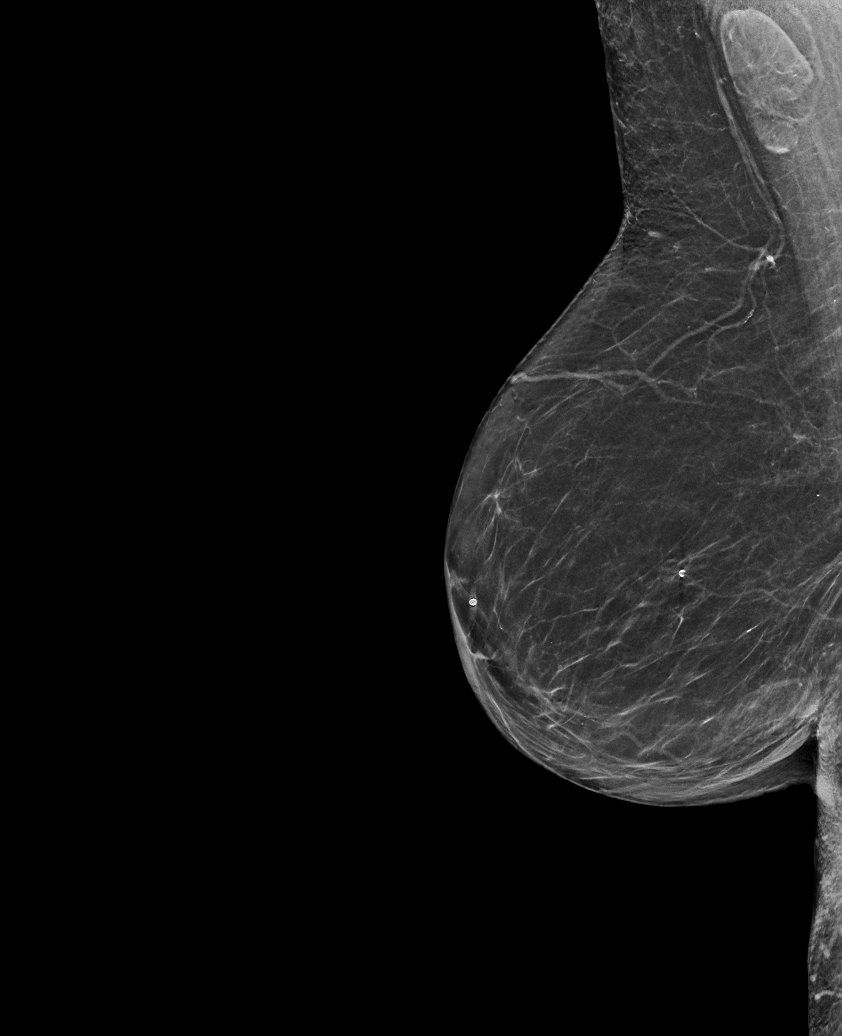

[L CC synth-2D]
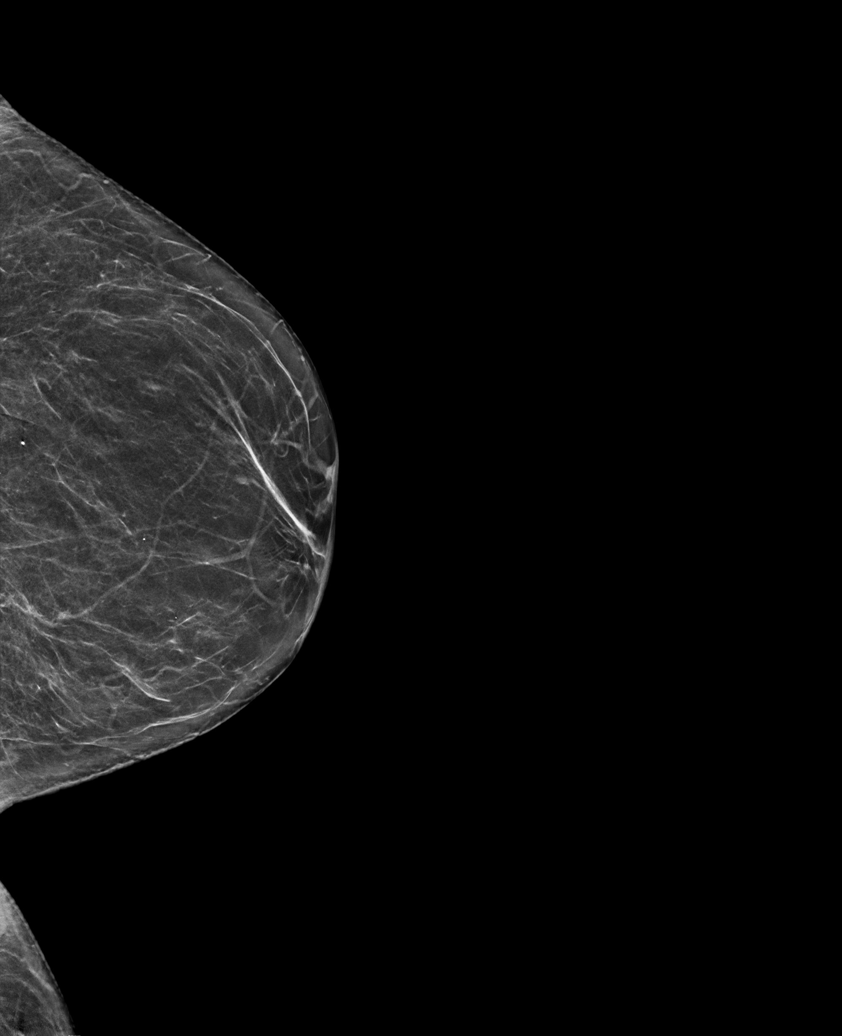

[R CC synth-2D]
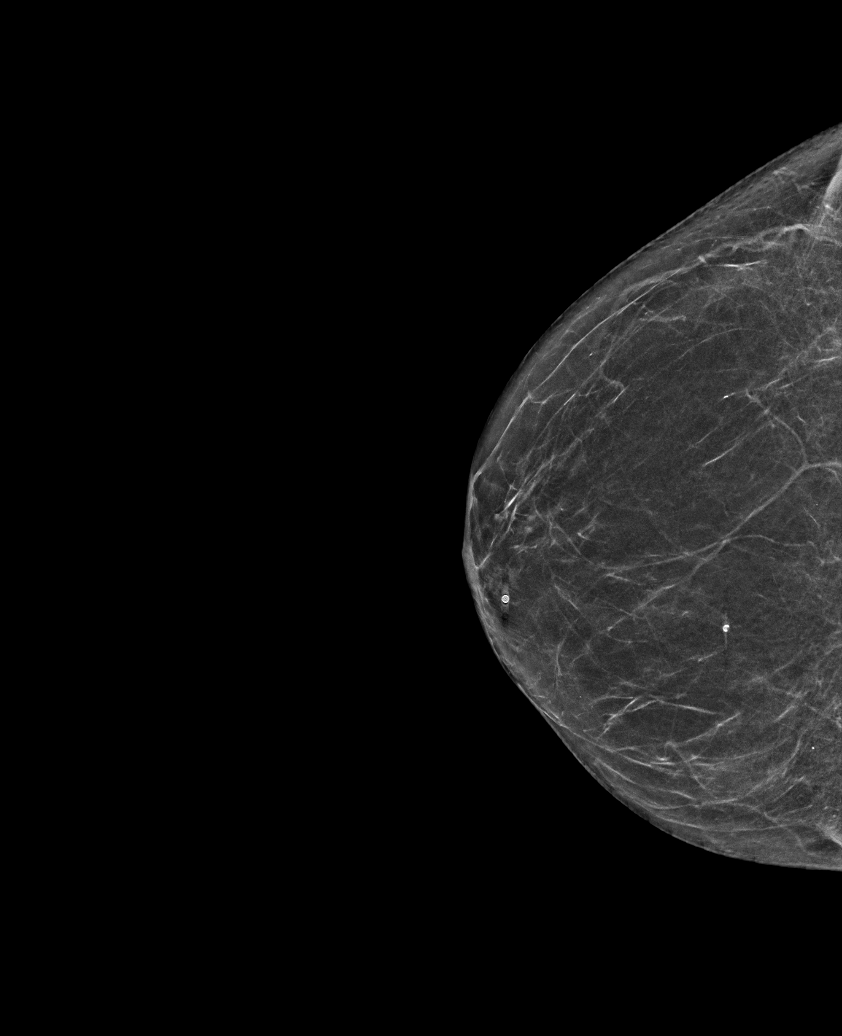

[L XCCL synth-2D]
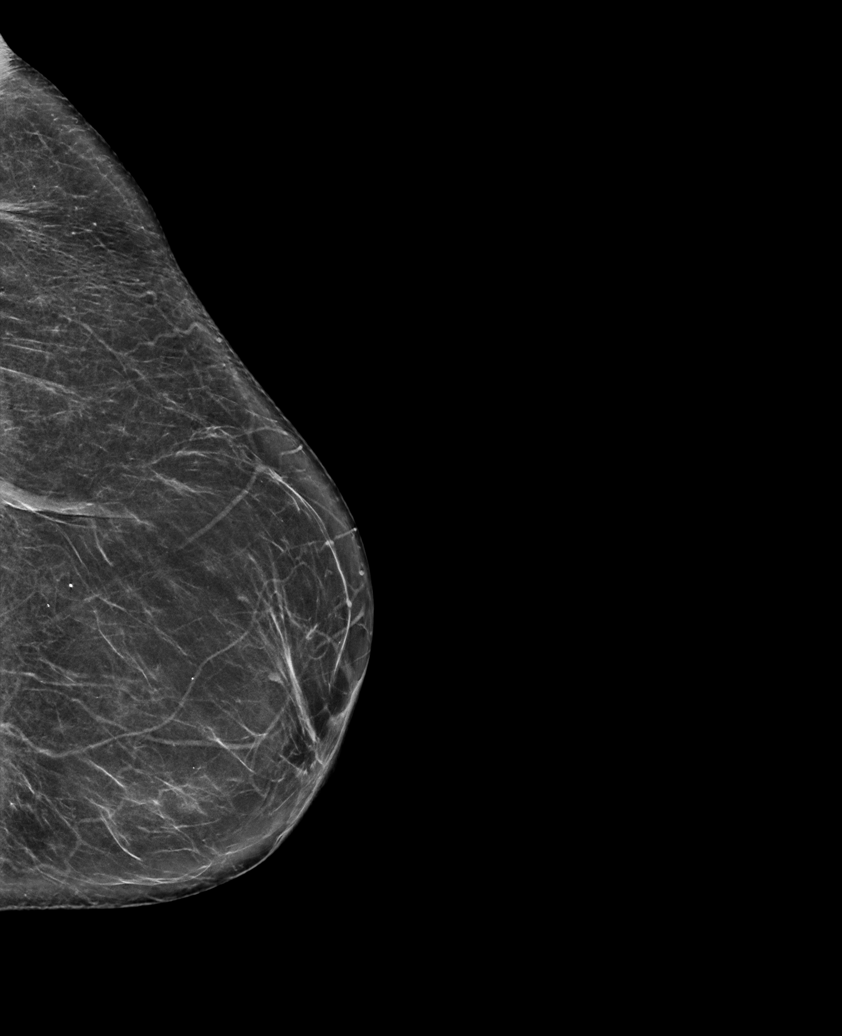

[L MLO synth-2D]
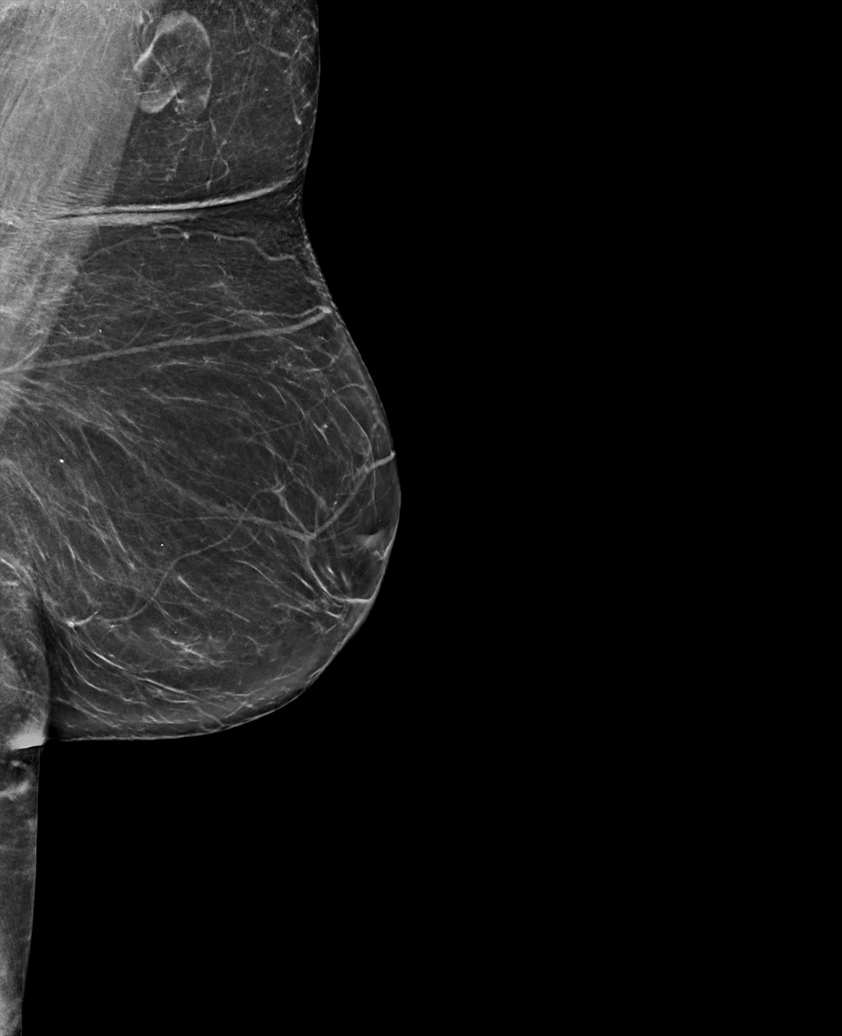

[L CC tomo · tomo slice 31/61.0]
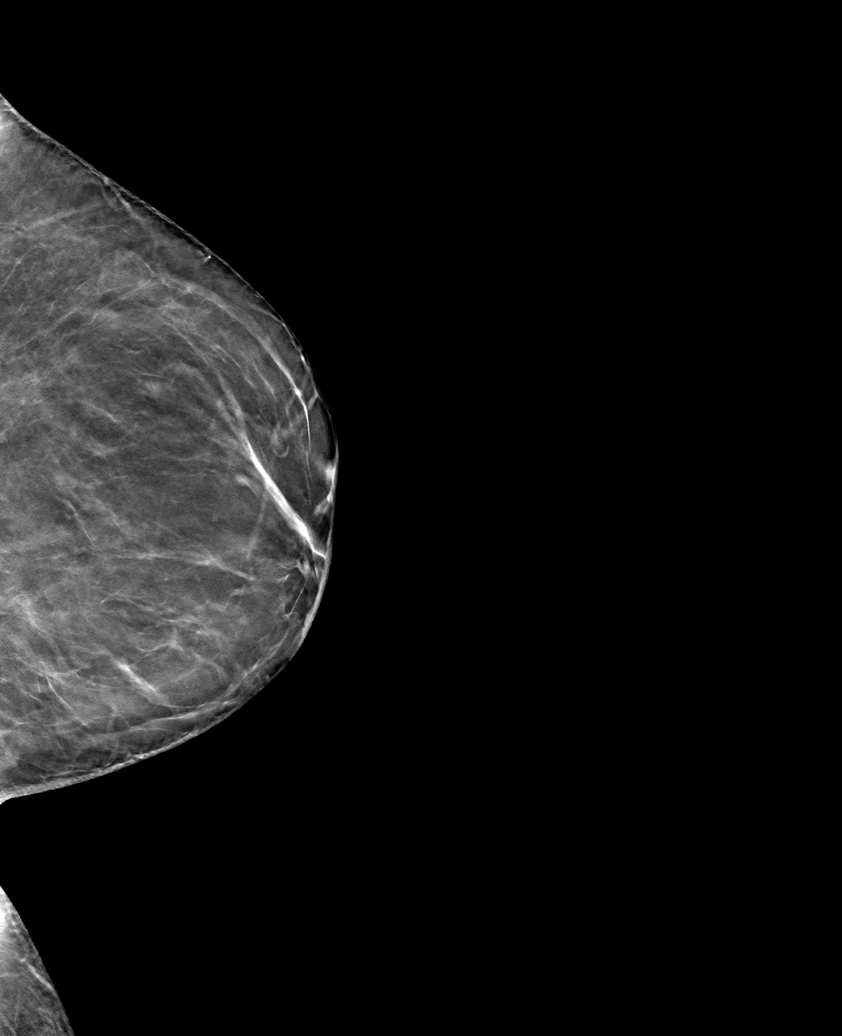

[6 of 30 positions shown; findings below may reference images not displayed]

ACR Breast Density Category b: There are scattered areas of
fibroglandular density.
FINDINGS: There are no findings suspicious for malignancy.
IMPRESSION: No mammographic evidence of malignancy. A result letter of this
screening mammogram will be mailed directly to the patient.

RECOMMENDATION:
Screening mammogram in one year. (Code:51-O-LD2)

BI-RADS CATEGORY  1: Negative.

## 2022-06-11 DIAGNOSIS — F3181 Bipolar II disorder: Secondary | ICD-10-CM | POA: Diagnosis not present

## 2022-06-11 DIAGNOSIS — F418 Other specified anxiety disorders: Secondary | ICD-10-CM | POA: Diagnosis not present

## 2022-06-11 DIAGNOSIS — G894 Chronic pain syndrome: Secondary | ICD-10-CM | POA: Diagnosis not present

## 2022-06-11 DIAGNOSIS — Z79899 Other long term (current) drug therapy: Secondary | ICD-10-CM | POA: Diagnosis not present

## 2022-08-25 DIAGNOSIS — F418 Other specified anxiety disorders: Secondary | ICD-10-CM | POA: Diagnosis not present

## 2022-08-25 DIAGNOSIS — Z79899 Other long term (current) drug therapy: Secondary | ICD-10-CM | POA: Diagnosis not present

## 2022-08-25 DIAGNOSIS — F3181 Bipolar II disorder: Secondary | ICD-10-CM | POA: Diagnosis not present

## 2022-08-25 DIAGNOSIS — G894 Chronic pain syndrome: Secondary | ICD-10-CM | POA: Diagnosis not present

## 2022-11-16 DIAGNOSIS — M170 Bilateral primary osteoarthritis of knee: Secondary | ICD-10-CM | POA: Diagnosis not present

## 2022-11-16 DIAGNOSIS — F3181 Bipolar II disorder: Secondary | ICD-10-CM | POA: Diagnosis not present

## 2022-11-16 DIAGNOSIS — G3184 Mild cognitive impairment, so stated: Secondary | ICD-10-CM | POA: Diagnosis not present

## 2022-11-16 DIAGNOSIS — G894 Chronic pain syndrome: Secondary | ICD-10-CM | POA: Diagnosis not present

## 2022-12-30 DIAGNOSIS — G894 Chronic pain syndrome: Secondary | ICD-10-CM | POA: Diagnosis not present

## 2022-12-30 DIAGNOSIS — Z5181 Encounter for therapeutic drug level monitoring: Secondary | ICD-10-CM | POA: Diagnosis not present

## 2022-12-30 DIAGNOSIS — F3181 Bipolar II disorder: Secondary | ICD-10-CM | POA: Diagnosis not present

## 2022-12-30 DIAGNOSIS — M170 Bilateral primary osteoarthritis of knee: Secondary | ICD-10-CM | POA: Diagnosis not present

## 2022-12-30 DIAGNOSIS — M1991 Primary osteoarthritis, unspecified site: Secondary | ICD-10-CM | POA: Diagnosis not present

## 2022-12-30 DIAGNOSIS — Z79899 Other long term (current) drug therapy: Secondary | ICD-10-CM | POA: Diagnosis not present

## 2023-03-31 DIAGNOSIS — G894 Chronic pain syndrome: Secondary | ICD-10-CM | POA: Diagnosis not present

## 2023-03-31 DIAGNOSIS — M170 Bilateral primary osteoarthritis of knee: Secondary | ICD-10-CM | POA: Diagnosis not present

## 2023-03-31 DIAGNOSIS — F3181 Bipolar II disorder: Secondary | ICD-10-CM | POA: Diagnosis not present

## 2023-04-29 DIAGNOSIS — H524 Presbyopia: Secondary | ICD-10-CM | POA: Diagnosis not present

## 2023-05-20 DIAGNOSIS — M545 Low back pain, unspecified: Secondary | ICD-10-CM | POA: Diagnosis not present

## 2023-05-20 DIAGNOSIS — M5030 Other cervical disc degeneration, unspecified cervical region: Secondary | ICD-10-CM | POA: Diagnosis not present

## 2023-05-20 DIAGNOSIS — M170 Bilateral primary osteoarthritis of knee: Secondary | ICD-10-CM | POA: Diagnosis not present

## 2023-05-20 DIAGNOSIS — G894 Chronic pain syndrome: Secondary | ICD-10-CM | POA: Diagnosis not present

## 2023-07-01 DIAGNOSIS — E1122 Type 2 diabetes mellitus with diabetic chronic kidney disease: Secondary | ICD-10-CM | POA: Diagnosis not present

## 2023-07-01 DIAGNOSIS — E538 Deficiency of other specified B group vitamins: Secondary | ICD-10-CM | POA: Diagnosis not present

## 2023-07-01 DIAGNOSIS — E782 Mixed hyperlipidemia: Secondary | ICD-10-CM | POA: Diagnosis not present

## 2023-07-08 DIAGNOSIS — I129 Hypertensive chronic kidney disease with stage 1 through stage 4 chronic kidney disease, or unspecified chronic kidney disease: Secondary | ICD-10-CM | POA: Diagnosis not present

## 2023-07-08 DIAGNOSIS — E782 Mixed hyperlipidemia: Secondary | ICD-10-CM | POA: Diagnosis not present

## 2023-07-08 DIAGNOSIS — E1122 Type 2 diabetes mellitus with diabetic chronic kidney disease: Secondary | ICD-10-CM | POA: Diagnosis not present

## 2023-07-08 DIAGNOSIS — N1832 Chronic kidney disease, stage 3b: Secondary | ICD-10-CM | POA: Diagnosis not present

## 2023-07-13 DIAGNOSIS — F3181 Bipolar II disorder: Secondary | ICD-10-CM | POA: Diagnosis not present

## 2023-07-13 DIAGNOSIS — M170 Bilateral primary osteoarthritis of knee: Secondary | ICD-10-CM | POA: Diagnosis not present

## 2023-07-13 DIAGNOSIS — M545 Low back pain, unspecified: Secondary | ICD-10-CM | POA: Diagnosis not present

## 2023-07-13 DIAGNOSIS — G894 Chronic pain syndrome: Secondary | ICD-10-CM | POA: Diagnosis not present

## 2023-09-09 DIAGNOSIS — F3181 Bipolar II disorder: Secondary | ICD-10-CM | POA: Diagnosis not present

## 2023-09-09 DIAGNOSIS — M545 Low back pain, unspecified: Secondary | ICD-10-CM | POA: Diagnosis not present

## 2023-09-09 DIAGNOSIS — G894 Chronic pain syndrome: Secondary | ICD-10-CM | POA: Diagnosis not present

## 2023-09-09 DIAGNOSIS — M170 Bilateral primary osteoarthritis of knee: Secondary | ICD-10-CM | POA: Diagnosis not present

## 2023-10-27 DIAGNOSIS — S86912A Strain of unspecified muscle(s) and tendon(s) at lower leg level, left leg, initial encounter: Secondary | ICD-10-CM | POA: Diagnosis not present

## 2023-10-27 DIAGNOSIS — I10 Essential (primary) hypertension: Secondary | ICD-10-CM | POA: Diagnosis not present

## 2023-10-27 DIAGNOSIS — F32A Depression, unspecified: Secondary | ICD-10-CM | POA: Diagnosis not present

## 2023-10-27 DIAGNOSIS — S93402A Sprain of unspecified ligament of left ankle, initial encounter: Secondary | ICD-10-CM | POA: Diagnosis not present

## 2023-11-14 DIAGNOSIS — R634 Abnormal weight loss: Secondary | ICD-10-CM | POA: Diagnosis not present

## 2023-11-14 DIAGNOSIS — N1832 Chronic kidney disease, stage 3b: Secondary | ICD-10-CM | POA: Diagnosis not present

## 2023-11-14 DIAGNOSIS — I129 Hypertensive chronic kidney disease with stage 1 through stage 4 chronic kidney disease, or unspecified chronic kidney disease: Secondary | ICD-10-CM | POA: Diagnosis not present

## 2023-11-14 DIAGNOSIS — E1122 Type 2 diabetes mellitus with diabetic chronic kidney disease: Secondary | ICD-10-CM | POA: Diagnosis not present

## 2023-12-05 DIAGNOSIS — N289 Disorder of kidney and ureter, unspecified: Secondary | ICD-10-CM | POA: Diagnosis not present

## 2024-01-06 DIAGNOSIS — G3184 Mild cognitive impairment, so stated: Secondary | ICD-10-CM | POA: Diagnosis not present

## 2024-01-06 DIAGNOSIS — F418 Other specified anxiety disorders: Secondary | ICD-10-CM | POA: Diagnosis not present

## 2024-01-06 DIAGNOSIS — G894 Chronic pain syndrome: Secondary | ICD-10-CM | POA: Diagnosis not present

## 2024-01-06 DIAGNOSIS — F3181 Bipolar II disorder: Secondary | ICD-10-CM | POA: Diagnosis not present

## 2024-02-17 DIAGNOSIS — I10 Essential (primary) hypertension: Secondary | ICD-10-CM | POA: Diagnosis not present

## 2024-02-17 DIAGNOSIS — M25512 Pain in left shoulder: Secondary | ICD-10-CM | POA: Diagnosis not present

## 2024-02-17 DIAGNOSIS — Z801 Family history of malignant neoplasm of trachea, bronchus and lung: Secondary | ICD-10-CM | POA: Diagnosis not present

## 2024-02-17 DIAGNOSIS — M60812 Other myositis, left shoulder: Secondary | ICD-10-CM | POA: Diagnosis not present

## 2024-02-23 DIAGNOSIS — I10 Essential (primary) hypertension: Secondary | ICD-10-CM | POA: Diagnosis not present

## 2024-02-23 DIAGNOSIS — M25512 Pain in left shoulder: Secondary | ICD-10-CM | POA: Diagnosis not present

## 2024-02-23 DIAGNOSIS — Z6830 Body mass index (BMI) 30.0-30.9, adult: Secondary | ICD-10-CM | POA: Diagnosis not present

## 2024-04-02 DIAGNOSIS — G894 Chronic pain syndrome: Secondary | ICD-10-CM | POA: Diagnosis not present

## 2024-04-02 DIAGNOSIS — F418 Other specified anxiety disorders: Secondary | ICD-10-CM | POA: Diagnosis not present

## 2024-04-02 DIAGNOSIS — Z79899 Other long term (current) drug therapy: Secondary | ICD-10-CM | POA: Diagnosis not present

## 2024-04-02 DIAGNOSIS — Z5181 Encounter for therapeutic drug level monitoring: Secondary | ICD-10-CM | POA: Diagnosis not present

## 2024-04-02 DIAGNOSIS — F3181 Bipolar II disorder: Secondary | ICD-10-CM | POA: Diagnosis not present

## 2024-04-02 DIAGNOSIS — G3184 Mild cognitive impairment, so stated: Secondary | ICD-10-CM | POA: Diagnosis not present

## 2024-04-03 DIAGNOSIS — N1832 Chronic kidney disease, stage 3b: Secondary | ICD-10-CM | POA: Diagnosis not present

## 2024-04-03 DIAGNOSIS — I10 Essential (primary) hypertension: Secondary | ICD-10-CM | POA: Diagnosis not present

## 2024-04-03 DIAGNOSIS — E1122 Type 2 diabetes mellitus with diabetic chronic kidney disease: Secondary | ICD-10-CM | POA: Diagnosis not present

## 2024-04-03 DIAGNOSIS — E782 Mixed hyperlipidemia: Secondary | ICD-10-CM | POA: Diagnosis not present

## 2024-04-24 DIAGNOSIS — H524 Presbyopia: Secondary | ICD-10-CM | POA: Diagnosis not present

## 2024-04-24 DIAGNOSIS — W06XXXA Fall from bed, initial encounter: Secondary | ICD-10-CM | POA: Diagnosis not present

## 2024-04-24 DIAGNOSIS — M25511 Pain in right shoulder: Secondary | ICD-10-CM | POA: Diagnosis not present

## 2024-04-24 DIAGNOSIS — I10 Essential (primary) hypertension: Secondary | ICD-10-CM | POA: Diagnosis not present

## 2024-05-28 DIAGNOSIS — I129 Hypertensive chronic kidney disease with stage 1 through stage 4 chronic kidney disease, or unspecified chronic kidney disease: Secondary | ICD-10-CM | POA: Diagnosis not present

## 2024-05-28 DIAGNOSIS — E1122 Type 2 diabetes mellitus with diabetic chronic kidney disease: Secondary | ICD-10-CM | POA: Diagnosis not present

## 2024-05-28 DIAGNOSIS — N1831 Chronic kidney disease, stage 3a: Secondary | ICD-10-CM | POA: Diagnosis not present

## 2024-09-19 DIAGNOSIS — I700 Atherosclerosis of aorta: Secondary | ICD-10-CM | POA: Diagnosis not present

## 2024-09-19 DIAGNOSIS — E1122 Type 2 diabetes mellitus with diabetic chronic kidney disease: Secondary | ICD-10-CM | POA: Diagnosis not present

## 2024-09-19 DIAGNOSIS — I10 Essential (primary) hypertension: Secondary | ICD-10-CM | POA: Diagnosis not present

## 2024-09-19 DIAGNOSIS — N1831 Chronic kidney disease, stage 3a: Secondary | ICD-10-CM | POA: Diagnosis not present
